# Patient Record
Sex: Male | Born: 1960 | Race: White | Hispanic: No | Marital: Married | Smoking: Current every day smoker
Health system: Southern US, Community
[De-identification: ages and names within clinical notes are randomized; demographics above are authoritative.]

## PROBLEM LIST (undated history)

## (undated) DIAGNOSIS — G4730 Sleep apnea, unspecified: Secondary | ICD-10-CM

## (undated) DIAGNOSIS — E785 Hyperlipidemia, unspecified: Secondary | ICD-10-CM

## (undated) DIAGNOSIS — I10 Essential (primary) hypertension: Secondary | ICD-10-CM

## (undated) DIAGNOSIS — R011 Cardiac murmur, unspecified: Secondary | ICD-10-CM

## (undated) DIAGNOSIS — Z720 Tobacco use: Secondary | ICD-10-CM

## (undated) DIAGNOSIS — G459 Transient cerebral ischemic attack, unspecified: Secondary | ICD-10-CM

## (undated) DIAGNOSIS — F99 Mental disorder, not otherwise specified: Secondary | ICD-10-CM

## (undated) DIAGNOSIS — I2510 Atherosclerotic heart disease of native coronary artery without angina pectoris: Secondary | ICD-10-CM

## (undated) DIAGNOSIS — F419 Anxiety disorder, unspecified: Secondary | ICD-10-CM

## (undated) DIAGNOSIS — I779 Disorder of arteries and arterioles, unspecified: Secondary | ICD-10-CM

## (undated) HISTORY — PX: CHOLECYSTECTOMY: SHX55

## (undated) HISTORY — DX: Disorder of arteries and arterioles, unspecified: I77.9

## (undated) HISTORY — DX: Tobacco use: Z72.0

## (undated) HISTORY — DX: Anxiety disorder, unspecified: F41.9

## (undated) HISTORY — DX: Essential (primary) hypertension: I10

## (undated) HISTORY — DX: Hyperlipidemia, unspecified: E78.5

## (undated) HISTORY — PX: ABDOMINAL AORTIC ANEURYSM REPAIR: SUR1152

## (undated) HISTORY — PX: CORONARY ARTERY BYPASS GRAFT: SHX141

## (undated) HISTORY — PX: APPENDECTOMY: SHX54

## (undated) HISTORY — PX: OTHER SURGICAL HISTORY: SHX169

## (undated) HISTORY — DX: Atherosclerotic heart disease of native coronary artery without angina pectoris: I25.10

---

## 2010-09-17 DIAGNOSIS — I779 Disorder of arteries and arterioles, unspecified: Secondary | ICD-10-CM

## 2010-09-17 HISTORY — DX: Disorder of arteries and arterioles, unspecified: I77.9

## 2010-09-28 ENCOUNTER — Inpatient Hospital Stay: Payer: Self-pay | Admitting: Internal Medicine

## 2010-10-02 ENCOUNTER — Ambulatory Visit: Payer: Self-pay | Admitting: Vascular Surgery

## 2010-10-12 DIAGNOSIS — I779 Disorder of arteries and arterioles, unspecified: Secondary | ICD-10-CM | POA: Insufficient documentation

## 2010-10-18 ENCOUNTER — Other Ambulatory Visit: Payer: Self-pay | Admitting: Internal Medicine

## 2010-10-18 ENCOUNTER — Emergency Department: Payer: Self-pay | Admitting: Emergency Medicine

## 2011-02-25 ENCOUNTER — Inpatient Hospital Stay: Payer: Self-pay | Admitting: Internal Medicine

## 2011-02-27 DIAGNOSIS — R079 Chest pain, unspecified: Secondary | ICD-10-CM

## 2011-03-14 ENCOUNTER — Encounter: Payer: Self-pay | Admitting: Internal Medicine

## 2011-03-18 ENCOUNTER — Encounter: Payer: Self-pay | Admitting: Internal Medicine

## 2011-03-28 ENCOUNTER — Ambulatory Visit (INDEPENDENT_AMBULATORY_CARE_PROVIDER_SITE_OTHER): Payer: BC Managed Care – PPO | Admitting: Psychology

## 2011-03-28 DIAGNOSIS — F4323 Adjustment disorder with mixed anxiety and depressed mood: Secondary | ICD-10-CM

## 2011-04-11 ENCOUNTER — Ambulatory Visit (INDEPENDENT_AMBULATORY_CARE_PROVIDER_SITE_OTHER): Payer: BC Managed Care – PPO | Admitting: Psychology

## 2011-04-11 DIAGNOSIS — F4323 Adjustment disorder with mixed anxiety and depressed mood: Secondary | ICD-10-CM

## 2011-04-18 ENCOUNTER — Encounter: Payer: Self-pay | Admitting: Internal Medicine

## 2011-05-02 ENCOUNTER — Ambulatory Visit (INDEPENDENT_AMBULATORY_CARE_PROVIDER_SITE_OTHER): Payer: BC Managed Care – PPO | Admitting: Psychology

## 2011-05-02 DIAGNOSIS — F4323 Adjustment disorder with mixed anxiety and depressed mood: Secondary | ICD-10-CM

## 2011-05-16 ENCOUNTER — Ambulatory Visit (INDEPENDENT_AMBULATORY_CARE_PROVIDER_SITE_OTHER): Payer: BC Managed Care – PPO | Admitting: Psychology

## 2011-05-16 DIAGNOSIS — F4323 Adjustment disorder with mixed anxiety and depressed mood: Secondary | ICD-10-CM

## 2011-05-30 ENCOUNTER — Ambulatory Visit: Payer: BC Managed Care – PPO | Admitting: Psychology

## 2011-06-12 ENCOUNTER — Ambulatory Visit: Payer: BC Managed Care – PPO | Admitting: Psychology

## 2011-06-20 ENCOUNTER — Ambulatory Visit: Payer: BC Managed Care – PPO | Admitting: Psychology

## 2011-10-10 ENCOUNTER — Encounter: Payer: Self-pay | Admitting: Internal Medicine

## 2011-10-10 ENCOUNTER — Other Ambulatory Visit: Payer: Self-pay | Admitting: *Deleted

## 2011-10-10 ENCOUNTER — Ambulatory Visit (INDEPENDENT_AMBULATORY_CARE_PROVIDER_SITE_OTHER): Payer: BC Managed Care – PPO | Admitting: Internal Medicine

## 2011-10-10 VITALS — BP 168/104 | HR 96 | Temp 98.5°F | Wt 219.0 lb

## 2011-10-10 DIAGNOSIS — J441 Chronic obstructive pulmonary disease with (acute) exacerbation: Secondary | ICD-10-CM

## 2011-10-10 DIAGNOSIS — F17200 Nicotine dependence, unspecified, uncomplicated: Secondary | ICD-10-CM

## 2011-10-10 DIAGNOSIS — I739 Peripheral vascular disease, unspecified: Secondary | ICD-10-CM | POA: Insufficient documentation

## 2011-10-10 DIAGNOSIS — Z720 Tobacco use: Secondary | ICD-10-CM | POA: Insufficient documentation

## 2011-10-10 DIAGNOSIS — Z716 Tobacco abuse counseling: Secondary | ICD-10-CM | POA: Insufficient documentation

## 2011-10-10 DIAGNOSIS — I10 Essential (primary) hypertension: Secondary | ICD-10-CM

## 2011-10-10 DIAGNOSIS — Z7189 Other specified counseling: Secondary | ICD-10-CM

## 2011-10-10 DIAGNOSIS — E785 Hyperlipidemia, unspecified: Secondary | ICD-10-CM

## 2011-10-10 LAB — CBC WITH DIFFERENTIAL/PLATELET
Eosinophils Absolute: 0 10*3/uL (ref 0.0–0.7)
HCT: 44.9 % (ref 39.0–52.0)
Hemoglobin: 15.2 g/dL (ref 13.0–17.0)
Lymphs Abs: 2.4 10*3/uL (ref 0.7–4.0)
MCH: 29.9 pg (ref 26.0–34.0)
MCHC: 33.9 g/dL (ref 30.0–36.0)
Monocytes Absolute: 0.6 10*3/uL (ref 0.1–1.0)
Monocytes Relative: 6 % (ref 3–12)
Neutro Abs: 7.9 10*3/uL — ABNORMAL HIGH (ref 1.7–7.7)
Neutrophils Relative %: 72 % (ref 43–77)
RBC: 5.09 MIL/uL (ref 4.22–5.81)

## 2011-10-10 MED ORDER — HYDROCODONE-ACETAMINOPHEN 5-500 MG PO TABS: 1.0000 | ORAL_TABLET | Freq: Four times a day (QID) | ORAL | Status: DC | PRN

## 2011-10-10 MED ORDER — LEVOFLOXACIN 500 MG PO TABS: 500.0000 mg | ORAL_TABLET | Freq: Every day | ORAL | Status: DC

## 2011-10-10 MED ORDER — PREDNISONE (PAK) 10 MG PO TABS: ORAL_TABLET | ORAL | Status: DC

## 2011-10-10 NOTE — Patient Instructions (Signed)
I am treating your for bbronchitis with prednsione, and levaquin.  Please finsih all or your medications.  I am adding AMLODIPINE FOR YOUR  Blood pressure .  One talbet daily

## 2011-10-10 NOTE — Progress Notes (Signed)
Subjective:    Patient ID: Bradley Prince, male    DOB: Feb 07, 1961, 51 y.o.   MRN: [id number]  HPI Bradley Prince is a 51 year old white male with a history of Klinefelter syndrome and histrionic personality disorder, chronic tobacco use with ongoing despite current coronary artery disease and peripheral vascular disease with interventions in 2011 and multiple hospitalizations and in 2012 for neurologic and musculoskeletal symptoms that were concerning for stroke. He has been lost to followup for the last 6-8 months and presents today with cough with initially with complaints of cough over anatomy is here he says he has not coughed at all but has been having chest pain for the last several days. He has resumed smoking and has not been taking his medications for blood for hypertension until very recently.   He does report some respiratory symptoms including a light cough with some wheezing. He's been taking Mucinex without significant relief. He denies fevers and myalgias.  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 Has been volunteering for the fire dept and last work was Monday morning, but had an air pack on.     /\ Past Medical History  Diagnosis Date  . Asthma   . Hypertension   . Hyperlipidemia   . Anxiety   . Tobacco abuse   . Peripheral arterial occlusive disease 2012    s/p PTCA/stent  . Coronary artery disease    No current outpatient prescriptions on file prior to visit.    Review of Systems  Constitutional: Negative for fever, chills, diaphoresis, activity change, appetite change, fatigue and unexpected weight change.  HENT: Negative for hearing loss, ear pain, nosebleeds, congestion, sore throat, facial swelling, rhinorrhea, sneezing, drooling, mouth sores, trouble swallowing, neck pain, neck stiffness, dental problem,  voice change, postnasal drip, sinus pressure, tinnitus and ear discharge.   Eyes: Negative for photophobia, pain, discharge, redness, itching and visual disturbance.  Respiratory: Positive for cough, chest tightness and shortness of breath. Negative for apnea, choking, wheezing and stridor.   Cardiovascular: Negative for chest pain, palpitations and leg swelling.  Gastrointestinal: Negative for nausea, vomiting, abdominal pain, diarrhea, constipation, blood in stool, abdominal distention, anal bleeding and rectal pain.  Genitourinary: Negative for dysuria, urgency, frequency, hematuria, flank pain, decreased urine volume, scrotal swelling, difficulty urinating and testicular pain.  Musculoskeletal: Negative for myalgias, back pain, joint swelling, arthralgias and gait problem.  Skin: Negative for color change, rash and wound.  Neurological: Negative for dizziness, tremors, seizures, syncope, speech difficulty, weakness, light-headedness, numbness and headaches.  Psychiatric/Behavioral: Negative for suicidal ideas, hallucinations, behavioral problems, confusion, sleep disturbance, dysphoric mood, decreased concentration and agitation. The patient is not nervous/anxious.        Objective:   Physical Exam  Constitutional: He is oriented to person, place, and time.  HENT:  Head: Normocephalic and atraumatic.  Mouth/Throat: Oropharynx is clear and moist.  Eyes: Conjunctivae and EOM are normal.  Neck: Normal range of motion. Neck supple. No JVD present. No thyromegaly present.  Cardiovascular: Normal rate, regular rhythm and normal heart sounds.   Pulmonary/Chest: Effort normal. He has wheezes. He has no rales.  Abdominal: Soft. Bowel sounds are normal. He exhibits no mass. There is no tenderness. There is no rebound.  Musculoskeletal: Normal range of motion. He exhibits no edema.  Neurological: He is alert and oriented  to person, place, and time.  Skin: Skin is warm and dry.  Psychiatric: He  has a normal mood and affect.          Assessment & Plan:

## 2011-10-11 ENCOUNTER — Encounter: Payer: Self-pay | Admitting: Internal Medicine

## 2011-10-11 DIAGNOSIS — I2510 Atherosclerotic heart disease of native coronary artery without angina pectoris: Secondary | ICD-10-CM | POA: Insufficient documentation

## 2011-10-11 DIAGNOSIS — E785 Hyperlipidemia, unspecified: Secondary | ICD-10-CM | POA: Insufficient documentation

## 2011-10-11 DIAGNOSIS — F419 Anxiety disorder, unspecified: Secondary | ICD-10-CM | POA: Insufficient documentation

## 2011-10-11 LAB — LIPID PANEL
Cholesterol: 221 mg/dL — ABNORMAL HIGH (ref 0–200)
HDL: 37 mg/dL — ABNORMAL LOW (ref >39)
Triglycerides: 232 mg/dL — ABNORMAL HIGH (ref <150)

## 2011-10-11 LAB — COMPREHENSIVE METABOLIC PANEL
Albumin: 4.6 g/dL (ref 3.5–5.2)
BUN: 7 mg/dL (ref 6–23)
CO2: 24 mEq/L (ref 19–32)
Calcium: 10.2 mg/dL (ref 8.4–10.5)
Glucose, Bld: 83 mg/dL (ref 70–99)
Potassium: 4.2 mEq/L (ref 3.5–5.3)
Sodium: 140 mEq/L (ref 135–145)
Total Protein: 7.7 g/dL (ref 6.0–8.3)

## 2011-10-11 LAB — CK TOTAL AND CKMB (NOT AT ARMC)

## 2011-10-11 NOTE — Assessment & Plan Note (Signed)
Spent 5 minutes exploring recent patient's reasons for resuming tobacco simply stated Dr. like the way the case he has no intention of quitting smoking at this time despite my discussion of the risks of recurrent and worsening peripheral vascular disease and coronary artery disease. We'll continue to advise him on some subsequent visits.

## 2011-10-11 NOTE — Assessment & Plan Note (Signed)
His hypertension is uncontrolled. He is undertaking his losartan for the last 2 weeks. He stopped the metoprolol because he did like to image her feel I am starting amlodipine today and ask him to return in a week for blood pressure check.

## 2011-10-11 NOTE — Assessment & Plan Note (Signed)
His cholesterol is above goal for someone with arteriosclerosis and continued tobacco abuse I will recommend resuming statin therapy however given his medical noncompliance I will not tolerate use of statin without regular regular followup for liver function testing will resume will discuss this at his next visit.

## 2011-10-11 NOTE — Assessment & Plan Note (Addendum)
He appears to be having a mild COPD exacerbation with wheezing on exam. He has not hypoxic and does not meet criteria for admission. We'll treat with Levaquin and prednisone for 7 days and evaluate further with chest x-ray if his symptoms do improve. Given his complaint of chest pain to the shoulder blades and to check cardiac enzymes today which were normal. Liver function tests were normal with exception of very mild elevation of alkaline phosphatase. If his pain persists we'll consider workup for gallbladder disease.

## 2011-10-12 ENCOUNTER — Other Ambulatory Visit: Payer: Self-pay

## 2011-10-12 ENCOUNTER — Encounter (HOSPITAL_COMMUNITY): Payer: Self-pay | Admitting: Cardiology

## 2011-10-12 ENCOUNTER — Telehealth: Payer: Self-pay | Admitting: Internal Medicine

## 2011-10-12 ENCOUNTER — Telehealth: Payer: Self-pay | Admitting: *Deleted

## 2011-10-12 ENCOUNTER — Emergency Department (HOSPITAL_COMMUNITY): Payer: BC Managed Care – PPO

## 2011-10-12 ENCOUNTER — Inpatient Hospital Stay (HOSPITAL_COMMUNITY)
Admission: EM | Admit: 2011-10-12 | Discharge: 2011-10-16 | DRG: 125 | Disposition: A | Discharge status: Home / Self Care | Payer: BC Managed Care – PPO | Attending: Internal Medicine | Admitting: Internal Medicine

## 2011-10-12 DIAGNOSIS — J441 Chronic obstructive pulmonary disease with (acute) exacerbation: Secondary | ICD-10-CM | POA: Diagnosis present

## 2011-10-12 DIAGNOSIS — Z720 Tobacco use: Secondary | ICD-10-CM | POA: Diagnosis present

## 2011-10-12 DIAGNOSIS — I2510 Atherosclerotic heart disease of native coronary artery without angina pectoris: Secondary | ICD-10-CM | POA: Diagnosis present

## 2011-10-12 DIAGNOSIS — R0789 Other chest pain: Principal | ICD-10-CM | POA: Diagnosis present

## 2011-10-12 DIAGNOSIS — R609 Edema, unspecified: Secondary | ICD-10-CM | POA: Diagnosis present

## 2011-10-12 DIAGNOSIS — F419 Anxiety disorder, unspecified: Secondary | ICD-10-CM | POA: Diagnosis present

## 2011-10-12 DIAGNOSIS — I739 Peripheral vascular disease, unspecified: Secondary | ICD-10-CM | POA: Diagnosis present

## 2011-10-12 DIAGNOSIS — R079 Chest pain, unspecified: Secondary | ICD-10-CM | POA: Diagnosis present

## 2011-10-12 DIAGNOSIS — Z8679 Personal history of other diseases of the circulatory system: Secondary | ICD-10-CM

## 2011-10-12 DIAGNOSIS — T466X5A Adverse effect of antihyperlipidemic and antiarteriosclerotic drugs, initial encounter: Secondary | ICD-10-CM | POA: Diagnosis present

## 2011-10-12 DIAGNOSIS — Z716 Tobacco abuse counseling: Secondary | ICD-10-CM

## 2011-10-12 DIAGNOSIS — I10 Essential (primary) hypertension: Secondary | ICD-10-CM | POA: Diagnosis present

## 2011-10-12 DIAGNOSIS — F17200 Nicotine dependence, unspecified, uncomplicated: Secondary | ICD-10-CM | POA: Diagnosis present

## 2011-10-12 DIAGNOSIS — R0600 Dyspnea, unspecified: Secondary | ICD-10-CM

## 2011-10-12 DIAGNOSIS — E785 Hyperlipidemia, unspecified: Secondary | ICD-10-CM | POA: Diagnosis present

## 2011-10-12 DIAGNOSIS — R531 Weakness: Secondary | ICD-10-CM | POA: Diagnosis not present

## 2011-10-12 DIAGNOSIS — E669 Obesity, unspecified: Secondary | ICD-10-CM | POA: Diagnosis present

## 2011-10-12 DIAGNOSIS — I779 Disorder of arteries and arterioles, unspecified: Secondary | ICD-10-CM | POA: Diagnosis present

## 2011-10-12 HISTORY — DX: Mental disorder, not otherwise specified: F99

## 2011-10-12 LAB — DIFFERENTIAL
Basophils Relative: 0 % (ref 0–1)
Eosinophils Absolute: 0 10*3/uL (ref 0.0–0.7)
Lymphs Abs: 2.1 10*3/uL (ref 0.7–4.0)
Monocytes Absolute: 0.7 10*3/uL (ref 0.1–1.0)
Monocytes Relative: 6 % (ref 3–12)
Neutrophils Relative %: 73 % (ref 43–77)

## 2011-10-12 LAB — BASIC METABOLIC PANEL
BUN: 10 mg/dL (ref 6–23)
Creatinine, Ser: 0.84 mg/dL (ref 0.50–1.35)
GFR calc Af Amer: >90 mL/min (ref >90)
GFR calc non Af Amer: >90 mL/min (ref >90)
Glucose, Bld: 107 mg/dL — ABNORMAL HIGH (ref 70–99)
Potassium: 3.6 mEq/L (ref 3.5–5.1)

## 2011-10-12 LAB — TROPONIN I: Troponin I: <0.3 ng/mL (ref <0.30)

## 2011-10-12 LAB — CBC
HCT: 41 % (ref 39.0–52.0)
Hemoglobin: 14 g/dL (ref 13.0–17.0)
MCH: 29.7 pg (ref 26.0–34.0)
MCHC: 34.1 g/dL (ref 30.0–36.0)
MCV: 87 fL (ref 78.0–100.0)
RBC: 4.71 MIL/uL (ref 4.22–5.81)

## 2011-10-12 LAB — CARDIAC PANEL(CRET KIN+CKTOT+MB+TROPI): Total CK: 62 U/L (ref 7–232)

## 2011-10-12 MED ORDER — IPRATROPIUM BROMIDE 0.02 % IN SOLN: 0.5000 mg | Freq: Four times a day (QID) | RESPIRATORY_TRACT | Status: DC | PRN

## 2011-10-12 MED ORDER — LORAZEPAM 2 MG/ML IJ SOLN
0.5000 mg | Freq: Four times a day (QID) | INTRAMUSCULAR | Status: DC | PRN
  Administered 2011-10-12 – 2011-10-15 (×5): 0.5 mg via INTRAVENOUS
  Filled 2011-10-12 (×5): qty 1

## 2011-10-12 MED ORDER — ONDANSETRON HCL 4 MG/2ML IJ SOLN
INTRAMUSCULAR | Status: AC
  Filled 2011-10-12: qty 2

## 2011-10-12 MED ORDER — HYDROCODONE-ACETAMINOPHEN 5-325 MG PO TABS
1.0000 | ORAL_TABLET | Freq: Four times a day (QID) | ORAL | Status: DC | PRN
  Administered 2011-10-12 – 2011-10-13 (×2): 1 via ORAL
  Filled 2011-10-12 (×2): qty 1

## 2011-10-12 MED ORDER — MORPHINE SULFATE 2 MG/ML IJ SOLN
2.0000 mg | Freq: Once | INTRAMUSCULAR | Status: AC
  Administered 2011-10-12: 2 mg via INTRAVENOUS
  Filled 2011-10-12: qty 1

## 2011-10-12 MED ORDER — IOHEXOL 350 MG/ML SOLN
50.0000 mL | Freq: Once | INTRAVENOUS | Status: AC | PRN
  Administered 2011-10-12: 50 mL via INTRAVENOUS

## 2011-10-12 MED ORDER — NITROGLYCERIN 2 % TD OINT
TOPICAL_OINTMENT | TRANSDERMAL | Status: AC
  Filled 2011-10-12: qty 1

## 2011-10-12 MED ORDER — IPRATROPIUM BROMIDE 0.02 % IN SOLN
0.5000 mg | Freq: Once | RESPIRATORY_TRACT | Status: AC
  Administered 2011-10-12: 0.5 mg via RESPIRATORY_TRACT
  Filled 2011-10-12: qty 2.5

## 2011-10-12 MED ORDER — ROSUVASTATIN CALCIUM 5 MG PO TABS
5.0000 mg | ORAL_TABLET | Freq: Every day | ORAL | Status: DC
  Administered 2011-10-12 – 2011-10-13 (×2): 5 mg via ORAL
  Filled 2011-10-12 (×3): qty 1

## 2011-10-12 MED ORDER — MORPHINE SULFATE 2 MG/ML IJ SOLN
1.0000 mg | INTRAMUSCULAR | Status: DC | PRN
  Administered 2011-10-12: 1 mg via INTRAVENOUS
  Filled 2011-10-12: qty 1

## 2011-10-12 MED ORDER — SODIUM CHLORIDE 0.9 % IV SOLN: 250.0000 mL | INTRAVENOUS | Status: DC | PRN

## 2011-10-12 MED ORDER — SODIUM CHLORIDE 0.9 % IJ SOLN
3.0000 mL | Freq: Two times a day (BID) | INTRAMUSCULAR | Status: DC
  Administered 2011-10-12 – 2011-10-14 (×4): 3 mL via INTRAVENOUS

## 2011-10-12 MED ORDER — ALBUTEROL SULFATE (5 MG/ML) 0.5% IN NEBU: 2.5000 mg | INHALATION_SOLUTION | RESPIRATORY_TRACT | Status: DC | PRN

## 2011-10-12 MED ORDER — LEVOFLOXACIN 500 MG PO TABS
500.0000 mg | ORAL_TABLET | Freq: Every day | ORAL | Status: DC
  Administered 2011-10-12 – 2011-10-15 (×4): 500 mg via ORAL
  Filled 2011-10-12 (×4): qty 1

## 2011-10-12 MED ORDER — MORPHINE SULFATE 4 MG/ML IJ SOLN
4.0000 mg | Freq: Once | INTRAMUSCULAR | Status: AC
  Administered 2011-10-12: 4 mg via INTRAVENOUS
  Filled 2011-10-12: qty 1

## 2011-10-12 MED ORDER — ONDANSETRON HCL 4 MG/2ML IJ SOLN
4.0000 mg | Freq: Once | INTRAMUSCULAR | Status: DC
  Filled 2011-10-12: qty 2

## 2011-10-12 MED ORDER — SODIUM CHLORIDE 0.9 % IJ SOLN: 3.0000 mL | INTRAMUSCULAR | Status: DC | PRN

## 2011-10-12 MED ORDER — ONDANSETRON HCL 4 MG/2ML IJ SOLN
4.0000 mg | Freq: Once | INTRAMUSCULAR | Status: AC
  Administered 2011-10-12 (×2): 4 mg via INTRAVENOUS

## 2011-10-12 MED ORDER — ONDANSETRON HCL 4 MG/2ML IJ SOLN
4.0000 mg | Freq: Four times a day (QID) | INTRAMUSCULAR | Status: DC | PRN
  Administered 2011-10-14: 4 mg via INTRAVENOUS

## 2011-10-12 MED ORDER — ONDANSETRON HCL 4 MG/2ML IJ SOLN
INTRAMUSCULAR | Status: AC
  Administered 2011-10-12: 4 mg via INTRAVENOUS
  Filled 2011-10-12: qty 2

## 2011-10-12 MED ORDER — NITROGLYCERIN 0.4 MG SL SUBL
0.4000 mg | SUBLINGUAL_TABLET | SUBLINGUAL | Status: DC | PRN
  Administered 2011-10-13: 0.4 mg via SUBLINGUAL
  Filled 2011-10-12: qty 25

## 2011-10-12 MED ORDER — NICOTINE 14 MG/24HR TD PT24
14.0000 mg | MEDICATED_PATCH | Freq: Every day | TRANSDERMAL | Status: DC
  Administered 2011-10-12 – 2011-10-16 (×5): 14 mg via TRANSDERMAL
  Filled 2011-10-12 (×5): qty 1

## 2011-10-12 MED ORDER — ALBUTEROL SULFATE (5 MG/ML) 0.5% IN NEBU
5.0000 mg | INHALATION_SOLUTION | Freq: Once | RESPIRATORY_TRACT | Status: AC
  Administered 2011-10-12: 5 mg via RESPIRATORY_TRACT
  Filled 2011-10-12: qty 1

## 2011-10-12 MED ORDER — ASPIRIN 81 MG PO TABS: 81.0000 mg | ORAL_TABLET | Freq: Every day | ORAL | Status: DC

## 2011-10-12 MED ORDER — AMLODIPINE BESYLATE 10 MG PO TABS
10.0000 mg | ORAL_TABLET | Freq: Every day | ORAL | Status: DC
  Administered 2011-10-13 – 2011-10-16 (×4): 10 mg via ORAL
  Filled 2011-10-12 (×4): qty 1

## 2011-10-12 MED ORDER — DIPHENHYDRAMINE HCL 25 MG PO CAPS: 25.0000 mg | ORAL_CAPSULE | Freq: Once | ORAL | Status: DC

## 2011-10-12 MED ORDER — PREDNISONE (PAK) 10 MG PO TABS
10.0000 mg | ORAL_TABLET | Freq: Every day | ORAL | Status: DC
  Administered 2011-10-13: 30 mg via ORAL
  Filled 2011-10-12: qty 21

## 2011-10-12 MED ORDER — AMLODIPINE BESYLATE 10 MG PO TABS: 10.0000 mg | ORAL_TABLET | Freq: Every day | ORAL | Status: DC

## 2011-10-12 MED ORDER — ENOXAPARIN SODIUM 40 MG/0.4ML ~~LOC~~ SOLN
40.0000 mg | SUBCUTANEOUS | Status: DC
  Administered 2011-10-12 – 2011-10-14 (×3): 40 mg via SUBCUTANEOUS
  Filled 2011-10-12 (×4): qty 0.4

## 2011-10-12 MED ORDER — LOSARTAN POTASSIUM 50 MG PO TABS
100.0000 mg | ORAL_TABLET | Freq: Every day | ORAL | Status: DC
  Administered 2011-10-12 – 2011-10-16 (×5): 100 mg via ORAL
  Filled 2011-10-12 (×5): qty 2

## 2011-10-12 NOTE — ED Notes (Signed)
Pt to department c/o increasing chest pain over the past 3 days. Reports that the pain got worse this morning at 3am. Pt reports SOB but denies any n/v with the pain. 324 ASA 2 SL nitro.

## 2011-10-12 NOTE — H&P (Addendum)
PCP:  Duncan Dull, MD, MD   DOA:  10/12/2011 11:40 AM  Chief Complaint:  Chest  Pain   HPI: 51 years old Caucasian man presented to the ER with chief complain of intermittent chest pain in Monday associated with shortness of breath and progressively getting worse. He described it as sharp pain on the left chest and radiating to his left shoulder, denies any associated nausea or diaphoresis. He was seen by his PCP couple of days ago and  was prescribed antibiotics and prednisone. His symptoms get worse today , he called his doctor will advise him to come to the ER. In the ED EKG was unremarkable, cardiac enzymes negative x2 however patient continued to have chest pain and we are asked to admit for further management.  Allergies: Allergies  Allergen Reactions  . Penicillins Itching  . Toradol Swelling    Tongue swells    Pri will give her a copyor to Admission medications   Medication Sig Start Date End Date Taking? Authorizing Provider  amLODipine (NORVASC) 10 MG tablet Take 10 mg by mouth daily.   Yes Historical Provider, MD  aspirin 81 MG tablet Take 81 mg by mouth daily.   Yes Historical Provider, MD  HYDROcodone-acetaminophen (VICODIN) 5-500 MG per tablet Take 1 tablet by mouth every 6 (six) hours as needed. For pain   Yes Historical Provider, MD  levofloxacin (LEVAQUIN) 500 MG tablet Take 500 mg by mouth daily. Started 10/10/11. For 7 days   Yes Historical Provider, MD  losartan (COZAAR) 100 MG tablet Take 100 mg by mouth daily.   Yes Historical Provider, MD  predniSONE (STERAPRED UNI-PAK) 10 MG tablet Take 10-60 mg by mouth daily. 60 mg on day 1, then reduce by 1 tablet each day until gone   Yes Historical Provider, MD    Past Medical History  Diagnosis Date  . Asthma   . Hypertension   . Hyperlipidemia   . Anxiety   . Tobacco abuse   . Peripheral arterial occlusive disease 2012    s/p PTCA/stent  . Coronary artery disease   . Mental disorder     histronic personality  disorder    Past Surgical History  Procedure Date  . Appendectomy   . Cholecystectomy   . Abdominal aortic aneurysm repair   . Renal artery stents     Social History:  reports that he has been smoking Cigarettes for more than 20 years currently smoking about half a pack a day. He reports that he drinks alcohol occasionally. He reports that he does not use illicit drugs.  Family History  Problem Relation Age of Onset  . Diabetes Mother   . Hypertension Father   His mother had heart disease and died in her 20s.   Review of Systems:  Constitutional: Denies fever, chills, diaphoresis, appetite change and fatigue.  HEENT: Denies photophobia, eye pain, redness, hearing loss, ear pain, congestion, sore throat, rhinorrhea, sneezing, mouth sores, trouble swallowing, neck pain, neck stiffness and tinnitus.   Respiratory: c/o SOB, DOE, denies cough, chest tightness,  and wheezing.   Cardiovascular: c/o  chest pain,denies palpitations and leg swelling.  Gastrointestinal: Denies nausea, vomiting, abdominal pain, diarrhea, constipation, blood in stool and abdominal distention.  Genitourinary: Denies dysuria, urgency, frequency, hematuria, flank pain and difficulty urinating.  Neurological: Denies dizziness, seizures, syncope, weakness, light-headedness, numbness and headaches.     Physical Exam:  Filed Vitals:   10/12/11 1435 10/12/11 1529 10/12/11 1746 10/12/11 1754  BP: 127/78 128/78 108/62 112/66  Pulse: 71  71 63  Temp:      TempSrc:      Resp: 24 29 14 24   SpO2: 98% 99% 96% 99%    Constitutional: Vital signs reviewed.  Patient is a  In moderate acute distress, anxious however  cooperative with exam. Alert and oriented x3.  Head: Normocephalic and atraumatic Eyes: PERRL, EOMI, conjunctivae normal, No scleral icterus.  Neck: Supple, Trachea midline normal ROM, No JVD, mass, thyromegaly, or carotid bruit present.  Cardiovascular: RRR, S1 normal, S2 normal, no MRG, pulses symmetric  and intact bilaterally Pulmonary/Chest: CTAB, no wheezes, rales, or rhonchi Abdominal: Soft. Non-tender, non-distended, bowel sounds are normal, no masses, organomegaly, or guarding present.  Ext: no edema and no cyanosis, pulses palpable bilaterally   Neurological: A&O x3, Strenght is normal and symmetric bilaterally, cranial nerve II-XII are grossly intact, no focal motor deficit, sensory intact to light touch bilaterally.    Labs on Admission:  Results for orders placed during the hospital encounter of 10/12/11 (from the past 48 hour(s))  CBC     Status: Normal   Collection Time   10/12/11 12:53 PM      Component Value Range Comment   WBC 10.3  4.0 - 10.5 (K/uL)    RBC 4.71  4.22 - 5.81 (MIL/uL)    Hemoglobin 14.0  13.0 - 17.0 (g/dL)    HCT 16.1  09.6 - 04.5 (%)    MCV 87.0  78.0 - 100.0 (fL)    MCH 29.7  26.0 - 34.0 (pg)    MCHC 34.1  30.0 - 36.0 (g/dL)    RDW 40.9  81.1 - 91.4 (%)    Platelets 300  150 - 400 (K/uL)   DIFFERENTIAL     Status: Normal   Collection Time   10/12/11 12:53 PM      Component Value Range Comment   Neutrophils Relative 73  43 - 77 (%)    Neutro Abs 7.5  1.7 - 7.7 (K/uL)    Lymphocytes Relative 20  12 - 46 (%)    Lymphs Abs 2.1  0.7 - 4.0 (K/uL)    Monocytes Relative 6  3 - 12 (%)    Monocytes Absolute 0.7  0.1 - 1.0 (K/uL)    Eosinophils Relative 0  0 - 5 (%)    Eosinophils Absolute 0.0  0.0 - 0.7 (K/uL)    Basophils Relative 0  0 - 1 (%)    Basophils Absolute 0.0  0.0 - 0.1 (K/uL)   BASIC METABOLIC PANEL     Status: Abnormal   Collection Time   10/12/11 12:53 PM      Component Value Range Comment   Sodium 139  135 - 145 (mEq/L)    Potassium 3.6  3.5 - 5.1 (mEq/L)    Chloride 103  96 - 112 (mEq/L)    CO2 26  19 - 32 (mEq/L)    Glucose, Bld 107 (*) 70 - 99 (mg/dL)    BUN 10  6 - 23 (mg/dL)    Creatinine, Ser 7.82  0.50 - 1.35 (mg/dL)    Calcium 8.9  8.4 - 10.5 (mg/dL)    GFR calc non Af Amer >90  >90 (mL/min)    GFR calc Af Amer >90  >90  (mL/min)   TROPONIN I     Status: Normal   Collection Time   10/12/11 12:53 PM      Component Value Range Comment   Troponin I <0.30  <0.30 (ng/mL)  D-DIMER, QUANTITATIVE     Status: Abnormal   Collection Time   10/12/11 12:53 PM      Component Value Range Comment   D-Dimer, Quant 0.74 (*) 0.00 - 0.48 (ug/mL-FEU)   TROPONIN I     Status: Normal   Collection Time   10/12/11  3:23 PM      Component Value Range Comment   Troponin I <0.30  <0.30 (ng/mL)     Radiological Exams on Admission: Dg Chest 2 View  10/12/2011  *RADIOLOGY REPORT*  Clinical Data: Shortness of breath  CHEST - 2 VIEW  Comparison: None.  Findings: Prior median sternotomy.  Cardiomegaly.  No confluent airspace opacities, effusions or edema.  No acute bony abnormality.  IMPRESSION: Cardiomegaly.  No active disease.  Original Report Authenticated By: Cyndie Chime, M.D.   Ct Angio Chest W/cm &/or Wo Cm  10/12/2011  *RADIOLOGY REPORT*  Clinical Data: Chest pain past 3 days.  Prior CABG.  Smoker. Cough.  Asthma. Shortness of breath.  CT ANGIOGRAPHY CHEST  .  IMPRESSION: Artifact extends thru the pulmonary arteries.  No pulmonary embolus noted.  Prominence of the main pulmonary arteries may be related to component of pulmonary hypertension or possibly congenital in origin.  Post CABG.  Proximal right coronary artery calcifications.  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  Original Report Authenticated By: Fuller Canada, M.D.   Assessment/Plan Principal Problem:  *Chest pain Active Problems:  COPD with acute exacerbation  Hypertension  Tobacco abuse  Hyperlipidemia Plan Admit to telemetry. Cardiac enzymes are negative x2   continue nitroglycerin when necessary Oxygen supplementation next when necessary,nebs PRN Will continue outpatient Levaquin and prednisone ,started 10/10/11  for presumed COPD exacerbation. On exam today I do not  appreciate any rales or wheezing. Chest x-ray showed no acute abnormalities. CTA chest showed no PE however showed some atherosclerotic disease and calcification of the proximal right coronary artery and thoracic aorta ectasia  as above. We will order  For  2-D echocardiogram.check pro BNP . I will start him on Crestor  Dr Herbie Baltimore with [facility] was consulted and he will kindly see him in consultation.  patient was counseled on  smoking cessation, we will order for  nicotine patch .tobacco cessation consult. Blood pressure is controlled on losartan and amlodipine to continue. Patient is full code  Time Spent on Admission:  approximately 50 minutes   Bradley Prince 10/12/2011, 6:37 PM

## 2011-10-12 NOTE — ED Notes (Signed)
Returned from xray

## 2011-10-12 NOTE — Progress Notes (Signed)
Patient having left chest pain radiating to shoulder lasting about 20 minutes. Patient states it hurts when he breathes in. Continuous SOB, patient clammy. Chest pain and SOB unrelieved by morphine, ativan, or nitro. Vitals, T 97.8 P 64 R 18 BP 99/60 02 98 2L [place]. Dr. Herbie Baltimore was informed, and stopped by to see Bradley Prince. Prn morphine iv was increased to 2mg /ml, and ambien was ordered for administration to help Bradley Prince sleep. Will continue to monitor.

## 2011-10-12 NOTE — ED Notes (Addendum)
Attempted to call report but receiving RN is unavailable. Pt claimed that chest pain is better

## 2011-10-12 NOTE — ED Notes (Signed)
Assumed patient's care, pt alert,awake, oriented x 4, pt denies any chest discomfort but is c/o shortness of breath. O2 continues at 2 LPM/[place] with O2 saturation of 99%. Will conitnue to monitor.

## 2011-10-12 NOTE — ED Notes (Signed)
Provided patient with cranberry juice per request.

## 2011-10-12 NOTE — Telephone Encounter (Signed)
error 

## 2011-10-12 NOTE — Telephone Encounter (Signed)
Pt called stating that his chest pain is worse, radiating into left shoulder and neck this morning.  Per Dr. Darrick Huntsman, advised pt to go to ER, advised him to call EMS and not drive himself.  Follow up as needed.

## 2011-10-12 NOTE — ED Provider Notes (Signed)
History     CSN: [id number]  Arrival date & time 10/12/11  1140   First MD Initiated Contact with Patient 10/12/11 1153      Chief Complaint  Patient presents with  . Chest Pain    (Consider location/radiation/quality/duration/timing/severity/associated sxs/prior treatment) HPI History provided by pt.   Pt has had intermittent left-sided chest pressure for the past 4-5 days.  Usually lasts for approx and resolves spontaneously.  Pain radiated to jaw 2 days ago and today he feels it in his left shoulder.  Pain is non-positional, non-exertional and non-pleuritic.  Woke him from sleep last night.  Associated w/ nausea and SOB.  No known CAD.  Is non-compliant w/ HTN and cholesterol medication and smokes 1PPD.  H/o DVT in RLE following open heart surgery for aortic aneurysm 18years ago.  Is not currently anti-coagulated.  Per prior chart, seen by PCP 2 days ago and suspected of COPD at that time.  Was started on levaquin and prednisone which he has been compliant with.   Past Medical History  Diagnosis Date  . Asthma   . Hypertension   . Hyperlipidemia   . Anxiety   . Tobacco abuse   . Peripheral arterial occlusive disease 2012    s/p PTCA/stent  . Coronary artery disease     Past Surgical History  Procedure Date  . Appendectomy   . Cholecystectomy     Family History  Problem Relation Age of Onset  . Diabetes Mother   . Hypertension Father     History  Substance Use Topics  . Smoking status: Current Everyday Smoker -- 0.2 packs/day for 15 years    Types: Cigarettes  . Smokeless tobacco: Not on file  . Alcohol Use: Not on file      Review of Systems  All other systems reviewed and are negative.    Allergies  Penicillins and Toradol  Home Medications   Current Outpatient Rx  Name Route Sig Dispense Refill  . AMLODIPINE BESYLATE 10 MG PO TABS Oral Take 10 mg by mouth daily.    . ASPIRIN 81 MG PO TABS Oral Take 81 mg by mouth daily.    Marland Kitchen  HYDROCODONE-ACETAMINOPHEN 5-500 MG PO TABS Oral Take 1 tablet by mouth every 6 (six) hours as needed. For pain    . LEVOFLOXACIN 500 MG PO TABS Oral Take 500 mg by mouth daily. Started 10/10/11. For 7 days    . LOSARTAN POTASSIUM 100 MG PO TABS Oral Take 100 mg by mouth daily.    Marland Kitchen PREDNISONE (PAK) 10 MG PO TABS Oral Take 10-60 mg by mouth daily. 60 mg on day 1, then reduce by 1 tablet each day until gone      BP 153/92  Pulse 79  Temp(Src) 98 F (36.7 C) (Oral)  Resp 19  SpO2 100%  Physical Exam  Nursing note and vitals reviewed. Constitutional: He is oriented to person, place, and time. He appears well-developed and well-nourished. No distress.  HENT:  Head: Normocephalic and atraumatic.  Eyes:       Normal appearance  Neck: Normal range of motion.  Cardiovascular: Normal rate and regular rhythm.   Pulmonary/Chest: Effort normal and breath sounds normal. He exhibits no tenderness.       Mild respiratory distress w/ broken sentences.  His wife says that this is not atypical.   Abdominal: Soft. Bowel sounds are normal. He exhibits no distension. There is no tenderness. There is no guarding.  Musculoskeletal: He exhibits no  edema and no tenderness.       2+ Dorsalis Pedis pulses  Neurological: He is alert and oriented to person, place, and time.  Skin: Skin is warm and dry. No rash noted. He is not diaphoretic.  Psychiatric: He has a normal mood and affect. His behavior is normal.    ED Course  Procedures (including critical care time)   Date: 10/12/2011 (11:43)  Rate: 88  Rhythm: normal sinus rhythm  QRS Axis: normal  Intervals: normal  ST/T Wave abnormalities: normal  Conduction Disutrbances:none  Narrative Interpretation:   Old EKG Reviewed: none available   Date: 10/12/2011 (14:48)  Rate: 69  Rhythm: normal sinus rhythm  QRS Axis: normal  Intervals: normal  ST/T Wave abnormalities: normal  Conduction Disutrbances:none  Narrative Interpretation: artifact  Old EKG  Reviewed: unchanged     Labs Reviewed  BASIC METABOLIC PANEL - Abnormal; Notable for the following:    Glucose, Bld 107 (*)    All other components within normal limits  D-DIMER, QUANTITATIVE - Abnormal; Notable for the following:    D-Dimer, Quant 0.74 (*)    All other components within normal limits  CBC  DIFFERENTIAL  TROPONIN I  TROPONIN I  CARDIAC PANEL(CRET KIN+CKTOT+MB+TROPI)  BRAIN NATRIURETIC PEPTIDE  CBC  BASIC METABOLIC PANEL   Dg Chest 2 View  10/12/2011  *RADIOLOGY REPORT*  Clinical Data: Shortness of breath  CHEST - 2 VIEW  Comparison: None.  Findings: Prior median sternotomy.  Cardiomegaly.  No confluent airspace opacities, effusions or edema.  No acute bony abnormality.  IMPRESSION: Cardiomegaly.  No active disease.  Original Report Authenticated By: Cyndie Chime, M.D.     1. Dyspnea   2. Chest pain       MDM  Pt w/ poorly controlled HTN and cholesterol and smokes 1PPD presents w/ intermittent left-sided, radiating chest pressure w/ SOB and nausea x 1 week.  Remote h/o DVT.   Exam sig for mild respiratory distress and expiratory wheezing right lung base.  Labs sig for elevated d-dimer.  CXR neg.  Pt received aspirin en route.  Gave him a breathing tmnt which did not improve sx but pain resolved w/ morphine.  On re-evaluation, pain has worsened, pt is becoming more short of breath, diaphoretic and anxious appearing.  RR low 30s but VS otherwise stable. Reports that he is starting to feel bad.  EKG repeated and continues to be non-ischemic.  Repeat troponin as well as CT angio chest pending.   Nitro paste and second dose of morphine administered.  2:08 PM   RR, and pt is no longer diaphoretic or anxious appearing.  He may have been experiencing a panic attack; has a h/o anxiety.  CP has resolved and SOB slightly improved.  CTA chest neg for PE.  Second troponin neg.  Attempted to ambulate patient and became very dyspneic.  He continues to have intermittent CP.   [facility] consulted for admission for r/o MI.          Otilio Miu, [place] 10/13/11 [phone]

## 2011-10-12 NOTE — ED Notes (Signed)
Ordered meal tray. 

## 2011-10-12 NOTE — ED Notes (Signed)
[id number] Ready 

## 2011-10-12 NOTE — ED Notes (Signed)
Report given to Samantha RN

## 2011-10-12 NOTE — ED Notes (Signed)
Pt medicated as ordered for chest pain, will monitor

## 2011-10-13 ENCOUNTER — Other Ambulatory Visit: Payer: Self-pay

## 2011-10-13 LAB — BASIC METABOLIC PANEL WITH GFR
BUN: 10 mg/dL (ref 6–23)
CO2: 27 meq/L (ref 19–32)
Calcium: 8.3 mg/dL — ABNORMAL LOW (ref 8.4–10.5)
Chloride: 104 meq/L (ref 96–112)
Creatinine, Ser: 0.86 mg/dL (ref 0.50–1.35)
GFR calc Af Amer: >90 mL/min
GFR calc non Af Amer: >90 mL/min
Glucose, Bld: 129 mg/dL — ABNORMAL HIGH (ref 70–99)
Potassium: 4.9 meq/L (ref 3.5–5.1)
Sodium: 137 meq/L (ref 135–145)

## 2011-10-13 LAB — CBC
HCT: 38.3 % — ABNORMAL LOW (ref 39.0–52.0)
MCV: 89.3 fL (ref 78.0–100.0)
RBC: 4.29 MIL/uL (ref 4.22–5.81)
RDW: 14 % (ref 11.5–15.5)
WBC: 6.1 10*3/uL (ref 4.0–10.5)

## 2011-10-13 LAB — CARDIAC PANEL(CRET KIN+CKTOT+MB+TROPI)
CK, MB: 1.6 ng/mL (ref 0.3–4.0)
Relative Index: INVALID (ref 0.0–2.5)
Total CK: 78 U/L (ref 7–232)
Troponin I: <0.3 ng/mL (ref <0.30)

## 2011-10-13 MED ORDER — ISOSORBIDE MONONITRATE ER 30 MG PO TB24
30.0000 mg | ORAL_TABLET | Freq: Every day | ORAL | Status: DC
  Administered 2011-10-13 – 2011-10-14 (×2): 30 mg via ORAL
  Filled 2011-10-13 (×3): qty 1

## 2011-10-13 MED ORDER — ACETAMINOPHEN 325 MG PO TABS
650.0000 mg | ORAL_TABLET | Freq: Four times a day (QID) | ORAL | Status: DC | PRN
  Filled 2011-10-13: qty 2

## 2011-10-13 MED ORDER — ZOLPIDEM TARTRATE 5 MG PO TABS
5.0000 mg | ORAL_TABLET | Freq: Every evening | ORAL | Status: DC | PRN
  Administered 2011-10-13 – 2011-10-14 (×2): 5 mg via ORAL
  Filled 2011-10-13 (×2): qty 1

## 2011-10-13 MED ORDER — MORPHINE SULFATE 2 MG/ML IJ SOLN
2.0000 mg | INTRAMUSCULAR | Status: DC | PRN
  Administered 2011-10-13: 2 mg via INTRAVENOUS
  Filled 2011-10-13: qty 1

## 2011-10-13 MED ORDER — MORPHINE SULFATE 2 MG/ML IJ SOLN
2.0000 mg | INTRAMUSCULAR | Status: DC | PRN
  Administered 2011-10-13 – 2011-10-14 (×5): 2 mg via INTRAVENOUS
  Filled 2011-10-13 (×5): qty 1

## 2011-10-13 NOTE — ED Provider Notes (Signed)
Medical screening examination/treatment/procedure(s) were conducted as a shared visit with non-physician practitioner(s) and myself.  I personally evaluated the patient during the encounter  Scheryl Sanborn, MD 10/13/11 1108 

## 2011-10-13 NOTE — Consult Note (Signed)
BRIEF / PRELIMINARY CONSULT NOTE   I have seen and examined the patient as well as reviewed the chart.  Full note to follow.  51 y/o man with cardiac risk factors of HTN, HLD, chronic smoker as well as Renal Artery Stenosis s/p PTA/Stent in 2011 who is s/p Thoracic Aortic Aneurysm (TAA) Sgx - with Dacron graft placement & re-attachment of coronaries (1996).  He has never been told that he has a h/o CAD & does not remember having had a stress test.  He did have a cath prior to his surgery in 1996.  He began noting a pressure sensation in his chest - "like a cold is coming on" this past Mon (1/21)".  This feeling would "come & go" lasting up to ~20 min or so.  He was better on Tuesday, but the symptom recurred on Wed - associated with SOB and ~nausea.  He called his PMD & was seen - given steroids & Abx, for ? Wheezing / bronchitis -- with no help.  Sx abated on Wed, and he took the day off work on [employer] -- still thinking that he was brewing a cold.  Sx off on on on thurs, came on ~ 0300 on Friday AM with pressure / SOB & nausea, then resolved.  He got up to go to work & while driving out of his driveway, it recurred & radiated to throat & L shoulder.  Again, he felt SOB.  Called his PMD & told to call 911.  Admitted to [facility] to r/o MI & Rx possible URI.    [facility] consulted to assist with evaluating his Sx.  Exam - benign,  ? Some reproducibility of location of pain, but not clear.  Pain L upper chest --> to throat & L shoulder ECG - no dynamic ischemic changes, just ST flattening Troponin - neg x 2.   IMPRESSION:  SSx concering for crescendo angina -- some components are worrisome, location is not substernal - but described as pressure/heavy sensation.  However with Sx lasting up to 20 min, would expect + Troponins.    Notable Cardiac Risk Factors with no ECG changes & negative Troponins - TIMI Risk Score of 3 (>2 episodes of "angina" in past 24 hrs, ASA use, 3 RFs - smoking, HTN, HLD).     Other potential etiologies = MSK pain (he carries a Dx of Histrionic Personality Disorder), issues with the TAA graft (nothing noted on CTA chest), URI, GI less likely given location.  I am also concerned for the possibility of spontaneous coronary dissection (he likely has some connective tissue disorder that led to the TAA),    RECOMMENDATIONS:  My recommendation is that with his RISK FACTORS and story, we cannot discount the possibility of CAD.  I discussed with him the various options for how to proceed with either NST vs. Cath.  He is hoping that he feels well tomorrow & can go home.  Certainly, if his symptoms abate & he wants to go, this is reasonable with negative biomarkers.  We would help arrange for OP f/u to discuss NST vs. Cath @ that time.  He is not very excited about the prospect of cath, but does agree that this would be the most definitive was to resolve the question.    We will tentatively plan for [facility] +/- PCI on Monday -- pending no change in Sx over the weekend.  Would change to ACS Rx dose of Lovenox  I have added Imdur (he gets  HA with NTG paste) & increased the Morphine PRN dose.  Also wrote for Ambien for sleep  With his BP currently ~100, I am reluctant to add BB until his anatomy is defined.  Continue statin & smoking cessation counseling.  Continue treating URI Sx as you are.  We will follow along over the weekend.   Full note to follow in the AM.     Marykay Lex, M.D., M.S. [facility] [address] [place], [place]  [address]  [phone]  10/13/2011 1:01 AM

## 2011-10-13 NOTE — Consult Note (Signed)
[facility]  CONSULTATION NOTE  10/13/2011 2:22 AM  Reason for Consult: Intermittent Chest Pain  Requesting Physician: Baltazar Najjar, MD Primary Care Provider: Duncan Dull, MD, MD Consulting Cardiologist: Marykay Lex, M.D., MS   IMPRESSION:  SSx concering for crescendo angina -- some components are worrisome, location is not substernal - but described as pressure/heavy sensation. However with Sx lasting up to 20 min, would expect + Troponins.  Notable Cardiac Risk Factors with no ECG changes & negative Troponins - TIMI Risk Score of 3 (>2 episodes of "angina" in past 24 hrs, ASA use, 3 RFs - smoking, HTN, HLD).  Other potential etiologies = MSK pain (he carries a Dx of Histrionic Personality Disorder), issues with the TAA graft (nothing noted on CTA chest), URI, GI less likely given location. I am also concerned for the possibility of spontaneous coronary dissection (he likely has some connective tissue disorder that led to the TAA),   RECOMMENDATIONS:  My recommendation is that with his RISK FACTORS and story, we cannot discount the possibility of CAD. I discussed with him the various options for how to proceed with either NST vs. Cath. He is hoping that he feels well tomorrow & can go home. Certainly, if his symptoms abate & he wants to go, this is reasonable with negative biomarkers. We would help arrange for OP f/u to discuss NST vs. Cath @ that time.  He is not very excited about the prospect of cath, but does agree that this would be the most definitive was to resolve the question.  We will tentatively plan for [facility] +/- PCI on Monday -- pending no change in Sx over the weekend.  Would change to ACS Rx dose of Lovenox  I have added Imdur (he gets HA with NTG paste) & increased the Morphine PRN dose. Also wrote for Ambien for sleep  With his BP currently ~100, I am reluctant to add BB until his anatomy is defined.  Continue statin & smoking cessation  counseling.  Continue treating URI Sx as you are. We will follow along over the weekend.   ___________________________________________________________________  HPI: This is a 51 y.o. male with a past medical history significant for thoracic aortic aneurysm status post dacryon graft replacement 1996, renal artery stenosis status post stenting in 2012 as well as hypertension hyperlipidemia and chronic smoking who was in the emergency room at the recommendation of his primary physician for intermittent chest pain lasting since Monday (1/21). To the admitting physician he noted the pain was sharp pain in his left chest radiating to his left shoulder. To me he describes it as a heaviness in his chest. Monday he felt as though he was developing a cold and felt that portable heaviness in his chest.  Then it would come and go and last up to 20 minutes this I was not so she was short of breath. Today he felt better with having less frequently but Wednesday came back more significantly associated with shortness of breath and nausea. He denies any coughing or wheezing, but when he went to go see his primary physician, she noted wheezing and started him on antibiotics and steroids which had little help. By the time he arrived at her office his symptoms abated. He took today off on Thursday but continued to have intermittent symptoms they said would last up to 20 minutes At a time would come, and never truly go away. He awoke Friday morning around 3:00 with this pain and shortness of breath  and it radiated to his left shoulder. After this away he went back to sleep however when he got up to go to his car to drive to work, he had recurrence of symptoms which prompted her to call her physician. She recommended they called the 911, and he was brought to the emergency room. He is being admitted to [facility],  And we were contacted to assist in evaluation and management of his symptoms.  Cardiovascular ROS:  positive for - chest pain, dyspnea on exertion, paroxysmal nocturnal dyspnea and shortness of breath negative for - edema, irregular heartbeat, loss of consciousness, murmur, orthopnea, palpitations or rapid heart rate  ECG - no dynamic ischemic changes, just ST flattening  Troponin - neg x 2.   PMHx:  Past Medical History  Diagnosis Date  . Asthma   . Hypertension   . Hyperlipidemia   . Anxiety   . Tobacco abuse   . Peripheral arterial occlusive disease 2012    s/p PTCA/stent - renal artery stent (in [facility] )  . Coronary artery disease -- this is not documented. He has no recollection of any diagnosis of coronary disease    . Mental disorder     histronic personality disorder   Past Surgical History  Procedure Date  . Appendectomy   . Cholecystectomy   .  Thoracic Aortic Aneurysm Repair - with Dacron graft  1996  . Renal artery stents    FAMHx: Family History  Problem Relation Age of Onset  . Diabetes Mother   . Hypertension Father     SOCHx:  reports that he has been smoking Cigarettes.  He has a 3.75 pack-year smoking history. He has never used smokeless tobacco. He reports that he drinks alcohol. He reports that he does not use illicit drugs.  He works at [employer] as a Production designer, theatre/television/film.  ALLERGIES: Allergies  Allergen Reactions  . Penicillins Itching  . Toradol Swelling    Tongue swells    ROS: Pertinent items are noted in HPI. Review of Systems - General ROS: positive for  - chills and sleep disturbance negative for - fever, hot flashes, malaise or night sweats Psychological ROS: positive for - anxiety, sleep disturbances and (reported h/o Histrionic Personality Disorder) negative for - concentration difficulties, depression, irritability or suicidal ideation Ophthalmic ROS: positive for - uses glasses negative for - blurry vision, double vision, loss of vision, photophobia or scotomata Allergy and Immunology ROS: positive for - nasal  congestion negative for - hives Respiratory ROS: positive for - shortness of breath and wheezing negative for - cough, hemoptysis, orthopnea or stridor Gastrointestinal ROS: no abdominal pain, change in bowel habits, or black or bloody stools Genito-Urinary ROS: no dysuria, trouble voiding, or hematuria Musculoskeletal ROS: positive for - pain in shoulder - right negative for - gait disturbance, muscular weakness or swelling in leg - bilateral Neurological ROS: no TIA or stroke symptoms  HOME MEDICATIONS: Prescriptions prior to admission  Medication Sig Dispense Refill  . amLODipine (NORVASC) 10 MG tablet Take 10 mg by mouth daily.      Marland Kitchen aspirin 81 MG tablet Take 81 mg by mouth daily.      Marland Kitchen HYDROcodone-acetaminophen (VICODIN) 5-500 MG per tablet Take 1 tablet by mouth every 6 (six) hours as needed. For pain      . levofloxacin (LEVAQUIN) 500 MG tablet Take 500 mg by mouth daily. Started 10/10/11. For 7 days      . losartan (COZAAR) 100 MG tablet Take 100  mg by mouth daily.      . predniSONE (STERAPRED UNI-PAK) 10 MG tablet Take 10-60 mg by mouth daily. 60 mg on day 1, then reduce by 1 tablet each day until gone        HOSPITAL MEDICATIONS: Current Facility-Administered Medications  Medication Dose Route Frequency Provider Last Rate Last Dose  . 0.9 %  sodium chloride infusion  250 mL Intravenous PRN Baltazar Najjar, MD      . albuterol (PROVENTIL) (5 MG/ML) 0.5% nebulizer solution 2.5 mg  2.5 mg Nebulization Q4H PRN Baltazar Najjar, MD      . albuterol (PROVENTIL) (5 MG/ML) 0.5% nebulizer solution 5 mg  5 mg Nebulization Once Otilio Miu, PA   5 mg at 10/12/11 1238  . amLODipine (NORVASC) tablet 10 mg  10 mg Oral Daily Baltazar Najjar, MD      . enoxaparin (LOVENOX) injection 40 mg  40 mg Subcutaneous Q24H Baltazar Najjar, MD   40 mg at 10/12/11 2211  . HYDROcodone-acetaminophen (NORCO) 5-325 MG per tablet 1 tablet  1 tablet Oral Q6H PRN Baltazar Najjar, MD   1 tablet at 10/12/11  1848  . iohexol (OMNIPAQUE) 350 MG/ML injection 50 mL  50 mL Intravenous Once PRN Medication Radiologist, MD   50 mL at 10/12/11 1526  . ipratropium (ATROVENT) nebulizer solution 0.5 mg  0.5 mg Nebulization Once Otilio Miu, PA   0.5 mg at 10/12/11 1238  . ipratropium (ATROVENT) nebulizer solution 0.5 mg  0.5 mg Nebulization Q6H PRN Baltazar Najjar, MD      . isosorbide mononitrate (IMDUR) 24 hr tablet 30 mg  30 mg Oral Daily Marykay Lex, MD      . levofloxacin [facility]) tablet 500 mg  500 mg Oral Daily Baltazar Najjar, MD   500 mg at 10/12/11 2211  . LORazepam (ATIVAN) injection 0.5 mg  0.5 mg Intravenous Q6H PRN Baltazar Najjar, MD   0.5 mg at 10/12/11 2042  . losartan (COZAAR) tablet 100 mg  100 mg Oral Daily Baltazar Najjar, MD   100 mg at 10/12/11 2211  . morphine 2 MG/ML injection 2 mg  2 mg Intravenous Once Otilio Miu, PA   2 mg at 10/12/11 1409  . morphine 2 MG/ML injection 2 mg  2 mg Intravenous Once Cyndra Numbers, MD   2 mg at 10/12/11 1747  . morphine 2 MG/ML injection 2 mg  2 mg Intravenous Q4H PRN Marykay Lex, MD      . morphine 4 MG/ML injection 4 mg  4 mg Intravenous Once Otilio Miu, PA   4 mg at 10/12/11 1225  . nicotine (NICODERM CQ - dosed in mg/24 hours) patch 14 mg  14 mg Transdermal Daily Baltazar Najjar, MD   14 mg at 10/12/11 2210  . nitroGLYCERIN (NITROGLYN) 2 % ointment           . nitroGLYCERIN (NITROSTAT) SL tablet 0.4 mg  0.4 mg Sublingual Q5 min PRN Baltazar Najjar, MD      . ondansetron [facility]) injection 4 mg  4 mg Intravenous Once Otilio Miu, PA   4 mg at 10/12/11 1409  . ondansetron (ZOFRAN) injection 4 mg  4 mg Intravenous Once [employer], PA      . ondansetron (ZOFRAN) injection 4 mg  4 mg Intravenous Q6H PRN Baltazar Najjar, MD      . predniSONE (STERAPRED UNI-PAK) tablet 10-60 mg  10-60 mg Oral Daily Baltazar Najjar, MD      .  rosuvastatin (CRESTOR) tablet 5 mg  5 mg Oral Daily Baltazar Najjar, MD   5 mg  at 10/12/11 2210  . sodium chloride 0.9 % injection 3 mL  3 mL Intravenous Q12H Baltazar Najjar, MD   3 mL at 10/12/11 2215  . sodium chloride 0.9 % injection 3 mL  3 mL Intravenous PRN Baltazar Najjar, MD      . zolpidem (AMBIEN) tablet 5 mg  5 mg Oral QHS PRN Marykay Lex, MD   5 mg at 10/13/11 0056  . DISCONTD: aspirin tablet 81 mg  81 mg Oral Daily Baltazar Najjar, MD      . DISCONTD: diphenhydrAMINE (BENADRYL) capsule 25 mg  25 mg Oral Once Otilio Miu, PA      . DISCONTD: morphine 2 MG/ML injection 1 mg  1 mg Intravenous Q4H PRN Baltazar Najjar, MD   1 mg at 10/12/11 2235   VITALS: Blood pressure 99/62, pulse 64, temperature 97.8 F (36.6 C), temperature source Oral, resp. rate 18, SpO2 98.00%.  PHYSICAL EXAM: benign, ? Some reproducibility of location of pain, but not clear. Pain L upper chest --> to throat & L shoulder  General appearance: alert, cooperative, appears stated age, mild distress and anxious Neck: no adenopathy, no carotid bruit, no JVD, supple, symmetrical, trachea midline, thyroid not enlarged, symmetric, no tenderness/mass/nodules and thick -- webbed appearance Lungs: clear to auscultation bilaterally, normal percussion bilaterally and fine R basal rhonchi Heart: regular rate and rhythm, S1, S2 normal, no murmur, click, rub or gallop Abdomen: soft, non-tender; bowel sounds normal; no masses,  no organomegaly Extremities: extremities normal, atraumatic, no cyanosis or edema Pulses: 2+ and symmetric Neurologic: Alert and oriented X 3, normal strength and tone. Normal symmetric reflexes. Normal coordination and gait; CN II-XII grossly intact  LABS: Results for orders placed during the hospital encounter of 10/12/11 (from the past 48 hour(s))  CBC     Status: Normal   Collection Time   10/12/11 12:53 PM      Component Value Range Comment   WBC 10.3  4.0 - 10.5 (K/uL)    RBC 4.71  4.22 - 5.81 (MIL/uL)    Hemoglobin 14.0  13.0 - 17.0 (g/dL)    HCT 11.9  14.7  - 82.9 (%)    MCV 87.0  78.0 - 100.0 (fL)    MCH 29.7  26.0 - 34.0 (pg)    MCHC 34.1  30.0 - 36.0 (g/dL)    RDW 56.2  13.0 - 86.5 (%)    Platelets 300  150 - 400 (K/uL)   DIFFERENTIAL     Status: Normal   Collection Time   10/12/11 12:53 PM      Component Value Range Comment   Neutrophils Relative 73  43 - 77 (%)    Neutro Abs 7.5  1.7 - 7.7 (K/uL)    Lymphocytes Relative 20  12 - 46 (%)    Lymphs Abs 2.1  0.7 - 4.0 (K/uL)    Monocytes Relative 6  3 - 12 (%)    Monocytes Absolute 0.7  0.1 - 1.0 (K/uL)    Eosinophils Relative 0  0 - 5 (%)    Eosinophils Absolute 0.0  0.0 - 0.7 (K/uL)    Basophils Relative 0  0 - 1 (%)    Basophils Absolute 0.0  0.0 - 0.1 (K/uL)   BASIC METABOLIC PANEL     Status: Abnormal   Collection Time   10/12/11 12:53 PM  Component Value Range Comment   Sodium 139  135 - 145 (mEq/L)    Potassium 3.6  3.5 - 5.1 (mEq/L)    Chloride 103  96 - 112 (mEq/L)    CO2 26  19 - 32 (mEq/L)    Glucose, Bld 107 (*) 70 - 99 (mg/dL)    BUN 10  6 - 23 (mg/dL)    Creatinine, Ser 4.78  0.50 - 1.35 (mg/dL)    Calcium 8.9  8.4 - 10.5 (mg/dL)    GFR calc non Af Amer >90  >90 (mL/min)    GFR calc Af Amer >90  >90 (mL/min)   TROPONIN I     Status: Normal   Collection Time   10/12/11 12:53 PM      Component Value Range Comment   Troponin I <0.30  <0.30 (ng/mL)   D-DIMER, QUANTITATIVE     Status: Abnormal   Collection Time   10/12/11 12:53 PM      Component Value Range Comment   D-Dimer, Quant 0.74 (*) 0.00 - 0.48 (ug/mL-FEU)   TROPONIN I     Status: Normal   Collection Time   10/12/11  3:23 PM      Component Value Range Comment   Troponin I <0.30  <0.30 (ng/mL)   CARDIAC PANEL(CRET KIN+CKTOT+MB+TROPI)     Status: Normal   Collection Time   10/12/11  7:04 PM      Component Value Range Comment   Total CK 62  7 - 232 (U/L)    CK, MB 1.7  0.3 - 4.0 (ng/mL)    Troponin I <0.30  <0.30 (ng/mL)    Relative Index RELATIVE INDEX IS INVALID  0.0 - 2.5      IMAGING: Dg Chest  2 View  10/12/2011  *RADIOLOGY REPORT*  Clinical Data: Shortness of breath  CHEST - 2 VIEW  Comparison: None.  Findings: Prior median sternotomy.  Cardiomegaly.  No confluent airspace opacities, effusions or edema.  No acute bony abnormality.  IMPRESSION: Cardiomegaly.  No active disease.  Original Report Authenticated By: Cyndie Chime, M.D.   Ct Angio Chest W/cm &/or Wo Cm  10/12/2011  *RADIOLOGY REPORT*  Clinical Data: Chest pain past 3 days.  Prior CABG.  Smoker. Cough.  Asthma. Shortness of breath.  CT ANGIOGRAPHY CHEST  Technique:  Multidetector CT imaging of the chest using the standard protocol during bolus administration of intravenous contrast. Multiplanar reconstructed images including MIPs were obtained and reviewed to evaluate the vascular anatomy.  Contrast: 50mL OMNIPAQUE IOHEXOL 350 MG/ML IV SOLN  Comparison: 10/12/2011 chest x-ray.  No earlier exams.  Findings: Artifact extends thru the pulmonary arteries.  No pulmonary embolus noted.  Prominence of the main pulmonary arteries may be related to component of pulmonary hypertension or possibly congenital in origin.  Post CABG.  Proximal right coronary artery calcifications.  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  No evidence of dissection.  Minimal atelectatic changes/linear scarring without focal worrisome lung mass.  Trachea and mainstem bronchi are patent.  No mediastinal, hilar or axillary adenopathy.  No bony destructive lesion.  3 cm liver cyst.  Post cholecystectomy.  Remainder of  visualized upper abdominal structures unremarkable.  IMPRESSION: Artifact extends thru the pulmonary arteries.  No pulmonary embolus noted.  Prominence of the main pulmonary arteries may be related to component of pulmonary hypertension or possibly congenital in origin.  Post CABG.  Proximal right coronary  artery calcifications.  Atherosclerotic type changes of the thoracic  aorta with ectasia of the ascending thoracic aorta which measures up to 4.1 cm.  Original Report Authenticated By: Fuller Canada, M.D.    Time Spent Directly with Patient: 30 minutes   HARDING,DAVID W, M.D., M.S. [facility] [address] [place], [place]  [address]  [phone]  10/13/2011 2:24 AM

## 2011-10-13 NOTE — Progress Notes (Signed)
Subjective: Still c/o left side chest pain which is worse when taking a deep breath.     Physical Exam: Blood pressure 97/64, pulse 73, temperature 98.9 F (37.2 C), temperature source Oral, resp. rate 18, height 5\' 11"  (1.803 m), weight 98.975 kg (218 lb 3.2 oz), SpO2 97.00%.  Temp:  [97.5 F (36.4 C)-98.9 F (37.2 C)] 98.9 F (37.2 C) (01/26 1400) Pulse Rate:  [63-85] 73  (01/26 1400) Resp:  [14-24] 18  (01/26 1400) BP: (97-135)/(62-78) 97/64 mmHg (01/26 1400) SpO2:  [96 %-99 %] 97 % (01/26 1400) Weight:  [98.975 kg (218 lb 3.2 oz)] 98.975 kg (218 lb 3.2 oz) (01/26 0500)  Alert and oriented x3 CVS: RRR RS: CTAB Abdomen : soft, NT   Investigations:  No results found for this or any previous visit (from the past 240 hour(s)).   Basic Metabolic Panel:  Basename 10/13/11 0630 10/12/11 1253  NA 137 139  K 4.9 3.6  CL 104 103  CO2 27 26  GLUCOSE 129* 107*  BUN 10 10  CREATININE 0.86 0.84  CALCIUM 8.3* 8.9  MG -- --  PHOS -- --   Liver Function Tests:  Basename 10/10/11 1734  AST 25  ALT 21  ALKPHOS 138*  BILITOT 1.0  PROT 7.7  ALBUMIN 4.6     CBC:  Basename 10/13/11 0630 10/12/11 1253 10/10/11 1734  WBC 6.1 10.3 --  NEUTROABS -- 7.5 7.9*  HGB 12.7* 14.0 --  HCT 38.3* 41.0 --  MCV 89.3 87.0 --  PLT 280 300 --   EKG: NSR, no ST changes    Dg Chest 2 View  10/12/2011  *RADIOLOGY REPORT*  Clinical Data: Shortness of breath  CHEST - 2 VIEW  Comparison: None.  Findings: Prior median sternotomy.  Cardiomegaly.  No confluent airspace opacities, effusions or edema.  No acute bony abnormality.  IMPRESSION: Cardiomegaly.  No active disease.  Original Report Authenticated By: Cyndie Chime, M.D.   Ct Angio Chest W/cm &/or Wo Cm  10/12/2011  *RADIOLOGY REPORT*  Clinical Data: Chest pain past 3 days.  Prior CABG.  Smoker. Cough.  Asthma. Shortness of breath.  CT ANGIOGRAPHY CHEST  Technique:  Multidetector CT imaging of the chest using the standard protocol  during bolus administration of intravenous contrast. Multiplanar reconstructed images including MIPs were obtained and reviewed to evaluate the vascular anatomy.  Contrast: 50mL OMNIPAQUE IOHEXOL 350 MG/ML IV SOLN  Comparison: 10/12/2011 chest x-ray.  No earlier exams.  Findings: Artifact extends thru the pulmonary arteries.  No pulmonary embolus noted.  Prominence of the main pulmonary arteries may be related to component of pulmonary hypertension or possibly congenital in origin.  Post CABG.  Proximal right coronary artery calcifications.  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  No evidence of dissection.  Minimal atelectatic changes/linear scarring without focal worrisome lung mass.  Trachea and mainstem bronchi are patent.  No mediastinal, hilar or axillary adenopathy.  No bony destructive lesion.  3 cm liver cyst.  Post cholecystectomy.  Remainder of  visualized upper abdominal structures unremarkable.  IMPRESSION: Artifact extends thru the pulmonary arteries.  No pulmonary embolus noted.  Prominence of the main pulmonary arteries may be related to component of pulmonary hypertension or possibly congenital in origin.  Post CABG.  Proximal right coronary artery calcifications.  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  Original Report Authenticated By: Fuller Canada, M.D.      Medications:  Scheduled:    . amLODipine  10 mg Oral Daily  . enoxaparin  40 mg Subcutaneous Q24H  . isosorbide mononitrate  30 mg Oral Daily  . levofloxacin  500 mg Oral Daily  . losartan  100 mg Oral Daily  .  morphine injection  2 mg Intravenous Once  . nicotine  14 mg Transdermal Daily  . nitroGLYCERIN      . ondansetron (ZOFRAN) IV  4 mg Intravenous Once  . predniSONE  10-60 mg Oral Daily  . rosuvastatin  5 mg Oral Daily  . sodium chloride  3 mL  Intravenous Q12H  . DISCONTD: aspirin  81 mg Oral Daily   Continuous:  WUJ:WJXBJY chloride, acetaminophen, albuterol, HYDROcodone-acetaminophen, ipratropium, LORazepam, morphine injection, nitroGLYCERIN, ondansetron, sodium chloride, zolpidem, DISCONTD:  morphine injection, DISCONTD:  morphine injection  Impression:  Principal Problem:  *Chest pain Active Problems:  COPD with acute exacerbation  Hypertension  Tobacco abuse  Hyperlipidemia  Hx of thoracic aortic aneurysm repair  To me the chest pain sounds pleuritic in nature    Plan: Continue to monitor Cycle CE Await outside records and cath  Tylenol and Morphine prn      LOS: 1 day   Eloni Darius, MD Pager: [phone] 10/13/2011, 4:40 PM

## 2011-10-13 NOTE — Progress Notes (Signed)
Pt c/o CP; VS stable; 2mg  Morphine Sulfate and 1 SL Ntg given; Dr. Lavera Guise notified.  Dr. Lavera Guise stated that there is no need to give any more SL Ntg due to the fact that the CP is probably not cardiac related.  EKG and Card enzymes have been WNL.  Will continue to monitor pt. Cardiology is also following pt.

## 2011-10-14 MED ORDER — DIAZEPAM 5 MG PO TABS
5.0000 mg | ORAL_TABLET | ORAL | Status: AC
  Administered 2011-10-15: 5 mg via ORAL
  Filled 2011-10-14: qty 1

## 2011-10-14 MED ORDER — HYDROCORTISONE SOD SUCCINATE 100 MG IJ SOLR
25.0000 mg | Freq: Two times a day (BID) | INTRAMUSCULAR | Status: DC
  Administered 2011-10-14 – 2011-10-15 (×3): 25 mg via INTRAVENOUS
  Filled 2011-10-14 (×4): qty 0.5

## 2011-10-14 MED ORDER — SODIUM CHLORIDE 0.9 % IV SOLN: 250.0000 mL | INTRAVENOUS | Status: DC | PRN

## 2011-10-14 MED ORDER — SODIUM CHLORIDE 0.9 % IV SOLN
1.0000 mL/kg/h | INTRAVENOUS | Status: DC
  Administered 2011-10-15: 1 mL/kg/h via INTRAVENOUS

## 2011-10-14 MED ORDER — MENTHOL 3 MG MT LOZG
1.0000 | LOZENGE | OROMUCOSAL | Status: DC | PRN
  Administered 2011-10-15: 3 mg via ORAL
  Filled 2011-10-14: qty 9

## 2011-10-14 MED ORDER — ASPIRIN 81 MG PO CHEW
324.0000 mg | CHEWABLE_TABLET | ORAL | Status: AC
  Administered 2011-10-15: 324 mg via ORAL
  Filled 2011-10-14: qty 4

## 2011-10-14 MED ORDER — SODIUM CHLORIDE 0.9 % IJ SOLN: 3.0000 mL | Freq: Two times a day (BID) | INTRAMUSCULAR | Status: DC

## 2011-10-14 MED ORDER — SODIUM CHLORIDE 0.9 % IJ SOLN: 3.0000 mL | INTRAMUSCULAR | Status: DC | PRN

## 2011-10-14 NOTE — Progress Notes (Signed)
2D Echo completed.  Raye Slyter Johnson, RDCS 

## 2011-10-14 NOTE — Progress Notes (Signed)
[facility]  Subjective: Still with intermittent chest pains More concerned with difficulty swallowing now.  Objective: Vital signs in last 24 hours: Temp:  [98.5 F (36.9 C)-98.9 F (37.2 C)] 98.5 F (36.9 C) (01/27 0600) Pulse Rate:  [66-87] 66  (01/27 0600) Resp:  [16-22] 16  (01/27 0600) BP: (97-115)/(63-75) 115/68 mmHg (01/27 0600) SpO2:  [97 %-98 %] 98 % (01/27 0600) Last BM Date: 10/12/11  Intake/Output from previous day: 01/26 0701 - 01/27 0700 In: 960 [P.O.:960] Out: 600 [Urine:600] Intake/Output this shift: Total I/O In: -  Out: 260 [Urine:260]  Medications Current Facility-Administered Medications  Medication Dose Route Frequency Provider Last Rate Last Dose  . 0.9 %  sodium chloride infusion  250 mL Intravenous PRN Baltazar Najjar, MD      . acetaminophen (TYLENOL) tablet 650 mg  650 mg Oral Q6H PRN Sorin Lavera Guise, MD      . albuterol (PROVENTIL) (5 MG/ML) 0.5% nebulizer solution 2.5 mg  2.5 mg Nebulization Q4H PRN Baltazar Najjar, MD      . amLODipine (NORVASC) tablet 10 mg  10 mg Oral Daily Baltazar Najjar, MD   10 mg at 10/13/11 1002  . enoxaparin (LOVENOX) injection 40 mg  40 mg Subcutaneous Q24H Baltazar Najjar, MD   40 mg at 10/13/11 1854  . hydrocortisone sodium succinate (SOLU-CORTEF) injection 25 mg  25 mg Intravenous Q12H Sorin Lavera Guise, MD      . ipratropium (ATROVENT) nebulizer solution 0.5 mg  0.5 mg Nebulization Q6H PRN Baltazar Najjar, MD      . isosorbide mononitrate (IMDUR) 24 hr tablet 30 mg  30 mg Oral Daily Marykay Lex, MD   30 mg at 10/13/11 1002  . levofloxacin (LEVAQUIN) tablet 500 mg  500 mg Oral Daily Baltazar Najjar, MD   500 mg at 10/13/11 1002  . LORazepam (ATIVAN) injection 0.5 mg  0.5 mg Intravenous Q6H PRN Baltazar Najjar, MD   0.5 mg at 10/13/11 1715  . losartan (COZAAR) tablet 100 mg  100 mg Oral Daily Baltazar Najjar, MD   100 mg at 10/13/11 1002  . menthol-cetylpyridinium (CEPACOL) lozenge 3 mg  1 lozenge Oral  PRN Sorin Laza, MD      . morphine 2 MG/ML injection 2 mg  2 mg Intravenous Q2H PRN Sorin Lavera Guise, MD   2 mg at 10/14/11 0844  . nicotine (NICODERM CQ - dosed in mg/24 hours) patch 14 mg  14 mg Transdermal Daily Baltazar Najjar, MD   14 mg at 10/13/11 1001  . nitroGLYCERIN (NITROSTAT) SL tablet 0.4 mg  0.4 mg Sublingual Q5 min PRN Baltazar Najjar, MD   0.4 mg at 10/13/11 1012  . ondansetron (ZOFRAN) injection 4 mg  4 mg Intravenous Once [employer], [employer]      . ondansetron (ZOFRAN) injection 4 mg  4 mg Intravenous Q6H PRN Baltazar Najjar, MD      . sodium chloride 0.9 % injection 3 mL  3 mL Intravenous Q12H Baltazar Najjar, MD   3 mL at 10/13/11 2121  . sodium chloride 0.9 % injection 3 mL  3 mL Intravenous PRN Baltazar Najjar, MD      . zolpidem (AMBIEN) tablet 5 mg  5 mg Oral QHS PRN Marykay Lex, MD   5 mg at 10/13/11 0056  . DISCONTD: HYDROcodone-acetaminophen (NORCO) 5-325 MG per tablet 1 tablet  1 tablet Oral Q6H PRN Baltazar Najjar, MD   1 tablet at 10/13/11 2125  . DISCONTD: morphine 2 MG/ML  injection 2 mg  2 mg Intravenous Q4H PRN Marykay Lex, MD   2 mg at 10/13/11 [phone]  . DISCONTD: predniSONE (STERAPRED UNI-PAK) tablet 10-60 mg  10-60 mg Oral Daily Baltazar Najjar, MD   30 mg at 10/13/11 1000  . DISCONTD: rosuvastatin (CRESTOR) tablet 5 mg  5 mg Oral Daily Baltazar Najjar, MD   5 mg at 10/13/11 1001    PE: BP 115/68  Pulse 66  Temp(Src) 98.5 F (36.9 C) (Oral)  Resp 16  Ht 5\' 11"  (1.803 m)  Wt 98.975 kg (218 lb 3.2 oz)  BMI 30.43 kg/m2  SpO2 98% General appearance: alert, cooperative, no distress, mildly obese and very anxious Neck: no adenopathy, no carotid bruit, no JVD, supple, symmetrical, trachea midline, thyroid not enlarged, symmetric, no tenderness/mass/nodules and thick Lungs: clear to auscultation bilaterally, normal percussion bilaterally and non-labored Heart: regular rate and rhythm, S1, S2 normal, no murmur, click, rub or gallop Abdomen: soft, non-tender;  bowel sounds normal; no masses,  no organomegaly Extremities: extremities normal, atraumatic, no cyanosis or edema Pulses: 2+ and symmetric Neurologic: Grossly normal  Lab Results:   Basename 10/13/11 0630 10/12/11 1253  WBC 6.1 10.3  HGB 12.7* 14.0  HCT 38.3* 41.0  PLT 280 300   BMET  Basename 10/13/11 0630 10/12/11 1253  NA 137 139  K 4.9 3.6  CL 104 103  CO2 27 26  GLUCOSE 129* 107*  BUN 10 10  CREATININE 0.86 0.84  CALCIUM 8.3* 8.9   CT ANGIOGRAPHY CHEST  Technique: Multidetector CT imaging of the chest using the  standard protocol during bolus administration of intravenous  contrast. Multiplanar reconstructed images including MIPs were  obtained and reviewed to evaluate the vascular anatomy.  Contrast: 50mL OMNIPAQUE IOHEXOL 350 MG/ML IV SOLN  Comparison: 10/12/2011 chest x-ray. No earlier exams.  Findings: Artifact extends thru the pulmonary arteries. No  pulmonary embolus noted. Prominence of the main pulmonary arteries  may be related to component of pulmonary hypertension or possibly  congenital in origin.  Post CABG (not post CABG). Proximal right coronary artery calcifications.  Atherosclerotic type changes of the thoracic aorta with ectasia of  the ascending thoracic aorta which measures up to 4.1 cm. No  evidence of dissection.  Minimal atelectatic changes/linear scarring without focal worrisome  lung mass. Trachea and mainstem bronchi are patent. No  mediastinal, hilar or axillary adenopathy.  No bony destructive lesion.  3 cm liver cyst. Post cholecystectomy. Remainder of visualized  upper abdominal structures unremarkable.   IMPRESSION:  Artifact extends thru the pulmonary arteries. No pulmonary embolus  noted. Prominence of the main pulmonary arteries may be related to  component of pulmonary hypertension or possibly congenital in  origin.  Post Sternotomy! (not CABG) Proximal right coronary artery calcifications.  Atherosclerotic type changes of  the thoracic aorta with ectasia of  the ascending thoracic aorta which measures up to 4.1 cm.  Assessment/Plan  Principal Problem:  *Chest pain Active Problems:  COPD with acute exacerbation  Hypertension  Tobacco abuse  Hyperlipidemia  Hx of thoracic aortic aneurysm repair  Plan:  Cardiac enzymes negative x four.  No pulmonary embolus by CT angio.    LOS: 2 days    HAGER,BRYAN W 10/14/2011 9:29 AM  I have seen and examined the patient along with Wilburt Finlay, PA.  I have reviewed the chart, notes and new data.  I agree with Bryan's note.  Key new complaints: throat is sore, still having some of the chest discomfort Key  examination changes: normal exam Key new findings / data: CE negative.  Normal labs  PLAN: Based on the CT with calcified RCA & atypical symptoms for angina with significant Cardiac RFs, I feel that the most expeditious way to determine if there is a cardiac cause for his chest discomfort is to pursue invasive cardiac evaluation as opposed to NST.  With the chronicity of his symptoms, he may well return with similar symptoms despite a potentially "negative Cardiolite/Myoview".    We discussed this predicament & he agrees to proceed provided that I am the one performing the procedure.  We will plan for LHC in AM.   CARDIAC CATHETERIZATION CONSENT   Performing MD:  Marykay Lex, M.D., M.S.  Procedure:  Left Heart Catheterization with Coronary Angiography, and Possible ad-hoc Percutaneous Coronary Angiography  The procedure with Risks/Benefits/Alternatives and Indications was reviewed with the patient.  All questions were answered.    Risks / Complications include, but not limited to: Death, MI, CVA/TIA, VF/VT (with defibrillation), Bradycardia (need for temporary pacer placement), contrast induced nephropathy, bleeding / bruising / hematoma / pseudoaneurysm, vascular or coronary injury (with possible emergent CT or Vascular Surgery), adverse medication  reactions, infection.    The patient voices understanding and agree to proceed.      Marykay Lex, M.D., M.S. [facility] [address] [place], [place]  [address]  [phone]  10/14/2011 10:08 AM

## 2011-10-14 NOTE — Progress Notes (Signed)
Subjective: C/o throat feeling different Chest pain is better   Old records could not be obtained - surgery was too long ago  Physical Exam: Blood pressure 115/68, pulse 66, temperature 98.5 F (36.9 C), temperature source Oral, resp. rate 16, height 5\' 11"  (1.803 m), weight 98.975 kg (218 lb 3.2 oz), SpO2 98.00%.  Temp:  [98.5 F (36.9 C)-98.9 F (37.2 C)] 98.5 F (36.9 C) (01/27 0600) Pulse Rate:  [66-87] 66  (01/27 0600) Resp:  [16-22] 16  (01/27 0600) BP: (97-115)/(63-75) 115/68 mmHg (01/27 0600) SpO2:  [97 %-98 %] 98 % (01/27 0600)  Alert and oriented x3 Anxious and nervous Chest : ctab Cor: rrr Abdomen: soft, NT Throat with minimal edema no erythema   Investigations:  No results found for this or any previous visit (from the past 240 hour(s)).   Basic Metabolic Panel:  Basename 10/13/11 0630 10/12/11 1253  NA 137 139  K 4.9 3.6  CL 104 103  CO2 27 26  GLUCOSE 129* 107*  BUN 10 10  CREATININE 0.86 0.84  CALCIUM 8.3* 8.9  MG -- --  PHOS -- --   Liver Function Tests: No results found for this basename: AST:2,ALT:2,ALKPHOS:2,BILITOT:2,PROT:2,ALBUMIN:2 in the last 72 hours   CBC:  Basename 10/13/11 0630 10/12/11 1253  WBC 6.1 10.3  NEUTROABS -- 7.5  HGB 12.7* 14.0  HCT 38.3* 41.0  MCV 89.3 87.0  PLT 280 300   EKG: NSR, no ST changes    Dg Chest 2 View  10/12/2011  *RADIOLOGY REPORT*  Clinical Data: Shortness of breath  CHEST - 2 VIEW  Comparison: None.  Findings: Prior median sternotomy.  Cardiomegaly.  No confluent airspace opacities, effusions or edema.  No acute bony abnormality.  IMPRESSION: Cardiomegaly.  No active disease.  Original Report Authenticated By: Cyndie Chime, M.D.   Ct Angio Chest W/cm &/or Wo Cm  10/12/2011  *RADIOLOGY REPORT*  Clinical Data: Chest pain past 3 days.  Prior CABG.  Smoker. Cough.  Asthma. Shortness of breath.  CT ANGIOGRAPHY CHEST  Technique:  Multidetector CT imaging of the chest using the standard protocol during  bolus administration of intravenous contrast. Multiplanar reconstructed images including MIPs were obtained and reviewed to evaluate the vascular anatomy.  Contrast: 50mL OMNIPAQUE IOHEXOL 350 MG/ML IV SOLN  Comparison: 10/12/2011 chest x-ray.  No earlier exams.  Findings: Artifact extends thru the pulmonary arteries.  No pulmonary embolus noted.  Prominence of the main pulmonary arteries may be related to component of pulmonary hypertension or possibly congenital in origin.  Post CABG.  Proximal right coronary artery calcifications.  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  No evidence of dissection.  Minimal atelectatic changes/linear scarring without focal worrisome lung mass.  Trachea and mainstem bronchi are patent.  No mediastinal, hilar or axillary adenopathy.  No bony destructive lesion.  3 cm liver cyst.  Post cholecystectomy.  Remainder of  visualized upper abdominal structures unremarkable.  IMPRESSION: Artifact extends thru the pulmonary arteries.  No pulmonary embolus noted.  Prominence of the main pulmonary arteries may be related to component of pulmonary hypertension or possibly congenital in origin.  Post CABG.  Proximal right coronary artery calcifications.  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  Original Report Authenticated By: Fuller Canada, M.D.   Results for ODELL, CHOUNG (MRN [id number]) as of 10/14/2011 08:12  Ref. Range 10/12/2011 15:23 10/12/2011 15:24 10/12/2011 19:04 10/13/2011 06:30 10/13/2011 16:56  CK, MB Latest Range: 0.3-4.0 ng/mL   1.7  1.6  CK Total Latest Range: 7-232 U/L   62  78  Troponin I Latest Range: <0.30 ng/mL <0.30  <0.30  <0.30     Medications:  Scheduled:    . amLODipine  10 mg Oral Daily  . enoxaparin  40 mg Subcutaneous Q24H  . isosorbide mononitrate  30 mg Oral Daily  .  levofloxacin  500 mg Oral Daily  . losartan  100 mg Oral Daily  . nicotine  14 mg Transdermal Daily  . ondansetron (ZOFRAN) IV  4 mg Intravenous Once  . predniSONE  10-60 mg Oral Daily  . rosuvastatin  5 mg Oral Daily  . sodium chloride  3 mL Intravenous Q12H   Continuous:  YQM:VHQION chloride, acetaminophen, albuterol, HYDROcodone-acetaminophen, ipratropium, LORazepam, morphine injection, nitroGLYCERIN, ondansetron, sodium chloride, zolpidem, DISCONTD:  morphine injection  Impression: New problem today of possible angioedema - unclear which moght be the culprit medication. He took crestor and vicodin before going to bed - so we will DC those 2 meds  Principal Problem:  *Chest pain Active Problems:  COPD with acute exacerbation  Hypertension  Tobacco abuse  Hyperlipidemia  Hx of thoracic aortic aneurysm repair  To me the chest pain sounds pleuritic in nature  3 sets of CE wnl  Plan: - iv steroids Monitor in hospital       LOS: 2 days   Laiden Milles, MD Pager: [phone] 10/14/2011, 8:12 AM

## 2011-10-14 NOTE — Progress Notes (Signed)
Patient Bradley Prince, 51 year old white male feels anxiety about his heart procedure tomorrow morning.  But he feels encouraged by the strong emotional support of his wife. Chaplain provided pastoral presence, prayer, and conversation.  Patient thanked Chaplain for his presence. For follow-up, I will refer patient to the Chaplain for unit 3700.

## 2011-10-14 NOTE — Progress Notes (Signed)
[facility] called this am to inform us that they did not have any Cath/Surgical records for pt.  Only records they have are from a knee surgery.  They only go back as far as 1995. Dr. Lavera Guise notified.

## 2011-10-15 ENCOUNTER — Other Ambulatory Visit: Payer: Self-pay

## 2011-10-15 ENCOUNTER — Inpatient Hospital Stay (HOSPITAL_COMMUNITY): Payer: BC Managed Care – PPO

## 2011-10-15 ENCOUNTER — Encounter (HOSPITAL_COMMUNITY)
Admission: EM | Disposition: A | Discharge status: Home / Self Care | Payer: Self-pay | Source: Home / Self Care | Attending: Internal Medicine

## 2011-10-15 HISTORY — PX: LEFT HEART CATHETERIZATION WITH CORONARY ANGIOGRAM: SHX5451

## 2011-10-15 LAB — CBC
HCT: 38.7 % — ABNORMAL LOW (ref 39.0–52.0)
Hemoglobin: 12.4 g/dL — ABNORMAL LOW (ref 13.0–17.0)
MCH: 28.8 pg (ref 26.0–34.0)
RBC: 4.31 MIL/uL (ref 4.22–5.81)

## 2011-10-15 LAB — PROTIME-INR
INR: 1.05 (ref 0.00–1.49)
Prothrombin Time: 13.9 seconds (ref 11.6–15.2)

## 2011-10-15 LAB — GLUCOSE, CAPILLARY: Glucose-Capillary: 77 mg/dL (ref 70–99)

## 2011-10-15 SURGERY — LEFT HEART CATHETERIZATION WITH CORONARY ANGIOGRAM
Anesthesia: LOCAL

## 2011-10-15 MED ORDER — NALOXONE HCL 0.4 MG/ML IJ SOLN
INTRAMUSCULAR | Status: AC
  Filled 2011-10-15: qty 1

## 2011-10-15 MED ORDER — FLUMAZENIL 1 MG/10ML IV SOLN
0.5000 mg | Freq: Once | INTRAVENOUS | Status: AC
  Administered 2011-10-15: 0.5 mg via INTRAVENOUS

## 2011-10-15 MED ORDER — FLUMAZENIL 1 MG/10ML IV SOLN
0.5000 mg | Freq: Once | INTRAVENOUS | Status: AC
  Administered 2011-10-15: 0.5 mg via INTRAVENOUS
  Filled 2011-10-15: qty 5

## 2011-10-15 MED ORDER — SODIUM CHLORIDE 0.9 % IJ SOLN: 3.0000 mL | INTRAMUSCULAR | Status: DC | PRN

## 2011-10-15 MED ORDER — SODIUM CHLORIDE 0.9 % IV SOLN
1.0000 mL/kg/h | INTRAVENOUS | Status: AC
  Administered 2011-10-15: 1 mL/kg/h via INTRAVENOUS

## 2011-10-15 MED ORDER — MIDAZOLAM HCL 2 MG/2ML IJ SOLN
INTRAMUSCULAR | Status: AC
  Filled 2011-10-15: qty 2

## 2011-10-15 MED ORDER — HYDRALAZINE HCL 20 MG/ML IJ SOLN
INTRAMUSCULAR | Status: AC
  Filled 2011-10-15: qty 1

## 2011-10-15 MED ORDER — HEPARIN (PORCINE) IN NACL 2-0.9 UNIT/ML-% IJ SOLN
INTRAMUSCULAR | Status: AC
  Filled 2011-10-15: qty 2000

## 2011-10-15 MED ORDER — ACETAMINOPHEN 650 MG RE SUPP: 650.0000 mg | RECTAL | Status: DC | PRN

## 2011-10-15 MED ORDER — LIDOCAINE HCL (PF) 1 % IJ SOLN
INTRAMUSCULAR | Status: AC
  Filled 2011-10-15: qty 30

## 2011-10-15 MED ORDER — HYDRALAZINE HCL 20 MG/ML IJ SOLN
10.0000 mg | INTRAMUSCULAR | Status: DC | PRN
  Filled 2011-10-15: qty 0.5

## 2011-10-15 MED ORDER — HYDRALAZINE HCL 20 MG/ML IJ SOLN
5.0000 mg | Freq: Once | INTRAMUSCULAR | Status: AC
  Administered 2011-10-15: 5 mg via INTRAVENOUS

## 2011-10-15 MED ORDER — SODIUM CHLORIDE 0.9 % IJ SOLN
3.0000 mL | Freq: Two times a day (BID) | INTRAMUSCULAR | Status: DC
  Administered 2011-10-15 – 2011-10-16 (×2): 3 mL via INTRAVENOUS

## 2011-10-15 MED ORDER — MORPHINE SULFATE 2 MG/ML IJ SOLN
1.0000 mg | INTRAMUSCULAR | Status: DC | PRN
  Administered 2011-10-15 (×2): 1 mg via INTRAVENOUS
  Filled 2011-10-15 (×2): qty 1

## 2011-10-15 MED ORDER — SODIUM CHLORIDE 0.9 % IV SOLN: 250.0000 mL | INTRAVENOUS | Status: DC | PRN

## 2011-10-15 MED ORDER — FLUMAZENIL 0.5 MG/5ML IV SOLN
INTRAVENOUS | Status: AC
  Filled 2011-10-15: qty 5

## 2011-10-15 MED ORDER — FENTANYL CITRATE 0.05 MG/ML IJ SOLN
INTRAMUSCULAR | Status: AC
  Filled 2011-10-15: qty 2

## 2011-10-15 MED ORDER — NITROGLYCERIN 0.2 MG/ML ON CALL CATH LAB
INTRAVENOUS | Status: AC
  Filled 2011-10-15: qty 1

## 2011-10-15 MED ORDER — NALOXONE HCL 0.4 MG/ML IJ SOLN
0.4000 mg | Freq: Once | INTRAMUSCULAR | Status: AC
  Administered 2011-10-15: 0.4 mg via INTRAVENOUS

## 2011-10-15 NOTE — Progress Notes (Signed)
Utilization Review Completed.Bradley Prince T1/28/2013   

## 2011-10-15 NOTE — Op Note (Signed)
[facility]     CARDIAC CATHETERIZATION REPORT  Bradley Prince   MRN: [id number] 03-Sep-1961   ADMIT DATE:  10/12/2011  Performing Cardiologist: Marykay Lex, MD, MS Primary Physician: Duncan Dull, MD, MD Primary Cardiologist:  Marykay Lex, MD, MS   Procedures Performed:  Left Heart Catheterization via 5 Fr Right Common Femoral Artery access  Left Ventriculography, (RAO) 11 ml/sec for 33 ml total contrast  Native Coronary Angiography   Indication(s): Recurrent Left Sided Chest Pain at rest   HTN, HLD & Smoker  S/p TAA repair  S/p RAS stenting  History: 51 y.o. male a past medical history significant for thoracic aortic aneurysm status post dacryon graft replacement 1996, renal artery stenosis status post stenting in 2012 as well as hypertension hyperlipidemia and chronic smoking who was in the emergency room at the recommendation of his primary physician for intermittent chest pain lasting since Monday (1/21). To the admitting physician he noted the pain was sharp pain in his left chest radiating to his left shoulder. To me he describes it as a heaviness in his chest. Monday he felt as though he was developing a cold and felt that portable heaviness in his chest. Then it would come and go and last up to 20 minutes this I was not so she was short of breath. Today he felt better with having less frequently but Wednesday came back more significantly associated with shortness of breath and nausea. He denies any coughing or wheezing, but when he went to go see his primary physician, she noted wheezing and started him on antibiotics and steroids which had little help. By the time he arrived at her office his symptoms abated. He took today off on Thursday but continued to have intermittent symptoms they said would last up to 20 minutes At a time would come, and never truly go away. He awoke Friday morning around 3:00 with this pain and shortness of breath  and it radiated to his left shoulder. After this away he went back to sleep however when he got up to go to his car to drive to work, he had recurrence of symptoms which prompted her to call her physician. She recommended they called the 911, and he was brought to the emergency room. He is being admitted to [facility]. After consultation, the decision was made to proceed with diagnostic cardiac catheterization based upon his RFs & CTA of chest with ? RCA disease.   Consent: The procedure with Risks/Benefits/Alternatives and Indications was reviewed with the patient.  All questions were answered.    Risks / Complications include, but not limited to: Death, MI, CVA/TIA, VF/VT (with defibrillation), Bradycardia (need for temporary pacer placement), contrast induced nephropathy, bleeding / bruising / hematoma / pseudoaneurysm, vascular or coronary injury (with possible emergent CT or Vascular Surgery), adverse medication reactions, infection.    The patient voiced understanding and agree to proceed.    Consent for signed by MD and patient with RN witness -- placed on chart.  Procedure: The patient was brought to the 2nd Floor [facility] in the fasting state and prepped and draped in the usual sterile fashion for Right groin access. Sterile technique was used including antiseptics, cap, gloves, gown, hand hygiene, mask and sheet.  Skin prep: Chlorhexidine;  Time Out: Verified patient identification, verified procedure, site/side was marked, verified correct patient position, special equipment/implants available, medications/allergies/relevent history reviewed, required imaging and test results available.  Performed  The right  femoral head was identified using tactile and fluoroscopic technique.  The right groin was anesthetized with 1% subcutaneous Lidocaine.  The right Common Femoral Artery was accessed using the Modified Seldinger Technique with placement of a  antimicrobial bonded/coated single lumen (5 Fr) sheath.  The sheath was aspirated and flushed.    A 5 Fr JL4 Catheter was advanced of over a Standard J wire into the ascending Aorta.  The catheter was used to engage the Left Coronary Artery.  Multiple cineangiographic views of the Left Coronary Artery system(s) were performed.   A 5 Fr JR4 Catheter was advanced of over a Standard J   wire into the ascending Aorta.  The catheter was used to engage the Right Coronary Artery.  Multiple cineangiographic views of the Right Coronary Artery system were performed.  This catheter was then exchanged over the standard J wire for an angled Pigtail catheter that was advanced across the Aortic Valve.  LV hemodynamics were measured,and and  andLeft Ventriculography was performed.  LV hemodynamics were then re-sampled, and the catheter was pulled back across the Aortic Valve for measurement of "pull-back" gradient.  The catheter and the wire were removed completely out of the body.  The patient was transferred to the holding area where the sheath was removed with manual pressure held for hemostasis.   I the holding area,the patient became less responsive - with concern for R sided weakness.  1 round of Narcan & 2 doses of Rumazecon were given. Patient is now more arousable & moving all extremities.  Have as asked for Neurology input.  The patient was transported to the PACU in moderately sedated, but hemodynamically stable condition.    The patient  was stable before, during and following the procedure - until arrival to PACU. Patient did tolerate procedure well. There were not complications during the procedure & the procedure went smoothly.  EBL: <44ml  Medications:  Premedication: 5mg   Valium (apparently given IV Ativan @ ~0600)  Sedation:  2 mg IV Versed, 50 IV mcg Fentanyl  Contrast:  75ml Omnipaque   Hemodynamics:  Central Aortic Pressure / Mean Aortic Pressure: 126/75 mmHg, 98 mmhg  LV Pressure /  LV End diastolic Pressure:  120/11 mmHg,  Left Ventriculography:  EF:  60-65%  Wall Motion: Normal  Coronary Angiographic Data: Right Dominant  Left Main:  Large caliber, angiographically normal; bifurcates into LAD, LCx  Left Anterior Descending (LAD):  Moderate-to-large caliber proximal vessel with very proximal small D1; both the ostial/proximal LAD & Diag have ~40% stenosis.  The LAD then essentially bifurcates into equal sized D2 & distal LAD.  D2 has several branches with no significant disease. The Distal LAD tapers to the apex & gives rise to a small D2 from the distal mid vessel.  Circumflex (LCx):  Large caliber, non-dominant vessel, gives rise to a very proximal small OM1 with ~50% ostial stenosis as well as a proximal atrial branch before branching into a bifurcating Moderate caliber OM (superior branch larger & bifurcating) and small caliber AV Groove artery.  No significant disease in the remainder of the circumflex system  Right Coronary Artery:  Large dominant vessel roughly 6 mm proximal has a moderate caliber marginal branch before bifurcating the relatively high bifurcation gives rise to a moderate to large caliber posterior descending artery and posterior atrioventricular groove vessel. No significant CAD this vessel.  The ostium has a somewhat anterior takeoff.  RPDA: Moderate to large caliber vessel, bifurcates the distal portion; minimal luminal irregularities  Right Posterior Atrioventricular Groove Branch: large caliber vessel that gives rise to a small posterior lateral branch followed by bifurcating into 2 larger posterior lateral branches an extensive distribution. No significant disease.  Impression: 1.   Essentially normal coronary arteries these are going moderate disease in the ostial LAD ostial D1 and ostial OM1.  No lesions to explain resting chest pain. 2.  Well-preserved, vigorous Left Ventricular Ejection Fraction with no regional wall motion  abnormalities 3.  Most likely etiology of chest pain is noncardiac.   Plan:   I have asked the neurology team to evaluate for his altered mental status however my associates of this is due to sedation medications as he received significant doses of benzodiazepines and narcotics as responded to reversal agents.  Transfer back to his bed following sheath removal.  Continue risk factor modification with blood pressure and lipid control.  Anticipate discharge based on hospitalist service assessment of noncardiac chest pain.  The case and results was discussed with the patient (and family).  The case and results was discussed with the patient's Cardiologist.  Time Spend Directly with Patient:   30  minutes  HARDING,DAVID W, M.D., M.S. [facility] [address] [place], [place]  [address]  [phone]  10/15/2011 10:09 AM

## 2011-10-15 NOTE — Consult Note (Signed)
Referring Physician: Herbie Baltimore    Chief Complaint: Right sided weakness, decreased responsiveness  HPI: Bradley Prince is an 51 y.o. male who underwent cardiac cath today.  After cath was noted to have altered mental status and a questionable right sided weakness.  Drugs were reversed and the patient's mental status improved.  He continued to have difficulty with the right side and a facial droop.  Consult was called at that time.  It should be noted that the patient had an episode a few months ago when he had right sided weakness.  Was felt to be secondary to stress at that time.  LSN: [id number] Given: No: Improvement in symptoms, unlikely stroke mRankin: 0  Past Medical History  Diagnosis Date  . Asthma   . Hypertension   . Hyperlipidemia   . Anxiety   . Tobacco abuse   . Peripheral arterial occlusive disease 2012    s/p PTCA/stent  . Coronary artery disease   . Mental disorder     histronic personality disorder    Past Surgical History  Procedure Date  . Appendectomy   . Cholecystectomy   . Abdominal aortic aneurysm repair   . Renal artery stents     Family History  Problem Relation Age of Onset  . Diabetes Mother   . Hypertension Father    Social History:  reports that he has been smoking Cigarettes.  He has a 3.75 pack-year smoking history. He has never used smokeless tobacco. He reports that he drinks alcohol. He reports that he does not use illicit drugs.  Allergies:  Allergies  Allergen Reactions  . Penicillins Itching  . Toradol Swelling    Tongue swells    Medications:  I have reviewed the patient's current medications. Scheduled:   . amLODipine  10 mg Oral Daily  . aspirin  324 mg Oral Pre-Cath  . diazepam  5 mg Oral On Call  . fentaNYL      . flumazenil  0.5 mg Intravenous Once  . flumazenil  0.5 mg Intravenous Once  . heparin      . hydrALAZINE  5 mg Intravenous Once  . hydrocortisone sod succinate (SOLU-CORTEF) injection  25 mg Intravenous  Q12H  . levofloxacin  500 mg Oral Daily  . lidocaine      . losartan  100 mg Oral Daily  . midazolam      . naloxone  0.4 mg Intravenous Once  . nicotine  14 mg Transdermal Daily  . nitroGLYCERIN      . sodium chloride  3 mL Intravenous Q12H  . DISCONTD: enoxaparin  40 mg Subcutaneous Q24H  . DISCONTD: isosorbide mononitrate  30 mg Oral Daily  . DISCONTD: ondansetron (ZOFRAN) IV  4 mg Intravenous Once  . DISCONTD: sodium chloride  3 mL Intravenous Q12H  . DISCONTD: sodium chloride  3 mL Intravenous Q12H    ROS: History obtained from the patient  General ROS: negative for - chills, fatigue, fever, night sweats, weight gain or weight loss Psychological ROS: negative for - behavioral disorder, hallucinations, memory difficulties, mood swings or suicidal ideation Ophthalmic ROS: negative for - blurry vision, double vision, eye pain or loss of vision ENT ROS: negative for - epistaxis, nasal discharge, oral lesions, sore throat, tinnitus or vertigo Allergy and Immunology ROS: negative for - hives or itchy/watery eyes Hematological and Lymphatic ROS: negative for - bleeding problems, bruising or swollen lymph nodes Endocrine ROS: negative for - galactorrhea, hair pattern changes, polydipsia/polyuria or temperature intolerance Respiratory  ROS: negative for - cough, hemoptysis, shortness of breath or wheezing Cardiovascular ROS: negative for - chest pain, dyspnea on exertion, edema or irregular heartbeat Gastrointestinal ROS: negative for - abdominal pain, diarrhea, hematemesis, nausea/vomiting or stool incontinence Genito-Urinary ROS: negative for - dysuria, hematuria, incontinence or urinary frequency/urgency Musculoskeletal ROS: negative for - joint swelling or muscular weakness Neurological ROS: as noted in HPI Dermatological ROS: negative for rash and skin lesion changes  Physical Examination: Blood pressure 133/84, pulse 72, temperature 97.6 F (36.4 C), temperature source Oral,  resp. rate 18, height 5\' 11"  (1.803 m), weight 102.105 kg (225 lb 1.6 oz), SpO2 98.00%.  Neurologic Examination: Mental Status: Alert, oriented, thought content appropriate.  Speech fluent without evidence of aphasia.  Able to follow 3 step commands without difficulty. Cranial Nerves: II: visual fields grossly normal, pupils equal, round, reactive to light and accommodation III,IV, VI: ptosis not present, extra-ocular motions intact bilaterally V,VII: mild decrease in right NLF.  Does not give full effort for smile. Facial light touch sensation normal bilaterally VIII: hearing normal bilaterally IX,X: gag reflex present XI: trapezius strength/neck flexion strength normal bilaterally XII: tongue strength normal  Motor: Right : Upper extremity   5/5    Left:     Upper extremity   5/5  Lower extremity   5/5     Lower extremity   5/5 No focal strength loss.  No drift.  Give way weakness noted throughout. Tone and bulk:normal tone throughout; no atrophy noted Sensory: Pinprick and light touch decreased on the right, splitting the midline on the trunk Deep Tendon Reflexes: 2+ and symmetric throughout Plantars: Right: mute   Left: mute Cerebellar: normal finger-to-nose and normal heel-to-shin test  Results for orders placed during the hospital encounter of 10/12/11 (from the past 48 hour(s))  CARDIAC PANEL(CRET KIN+CKTOT+MB+TROPI)     Status: Normal   Collection Time   10/13/11  4:56 PM      Component Value Range Comment   Total CK 78  7 - 232 (U/L)    CK, MB 1.6  0.3 - 4.0 (ng/mL)    Troponin I <0.30  <0.30 (ng/mL)    Relative Index RELATIVE INDEX IS INVALID  0.0 - 2.5    CBC     Status: Abnormal   Collection Time   10/15/11  5:35 AM      Component Value Range Comment   WBC 8.9  4.0 - 10.5 (K/uL)    RBC 4.31  4.22 - 5.81 (MIL/uL)    Hemoglobin 12.4 (*) 13.0 - 17.0 (g/dL)    HCT 11.9 (*) 14.7 - 52.0 (%)    MCV 89.8  78.0 - 100.0 (fL)    MCH 28.8  26.0 - 34.0 (pg)    MCHC 32.0  30.0  - 36.0 (g/dL)    RDW 82.9  56.2 - 13.0 (%)    Platelets 276  150 - 400 (K/uL)   PROTIME-INR     Status: Normal   Collection Time   10/15/11  5:35 AM      Component Value Range Comment   Prothrombin Time 13.9  11.6 - 15.2 (seconds)    INR 1.05  0.00 - 1.49    APTT     Status: Normal   Collection Time   10/15/11  5:35 AM      Component Value Range Comment   aPTT 30  24 - 37 (seconds)   GLUCOSE, CAPILLARY     Status: Normal   Collection Time   10/15/11  9:55 AM      Component Value Range Comment   Glucose-Capillary 77  70 - 99 (mg/dL)    Ct Head Wo Contrast  10/15/2011  *RADIOLOGY REPORT*  Clinical Data: From the cath lab.  Right arm weakness.  CT HEAD WITHOUT CONTRAST  Technique:  Contiguous axial images were obtained from the base of the skull through the vertex without contrast.  Comparison: None.  Findings: Contrast from recent catheterization without obvious intracranial hemorrhage.  No intracranial enhancing lesion.  Asymmetric white matter type changes greater on the left frontal lobe.  This may be related to the small vessel disease type changes although acute infarct is not excluded given the right arm weakness.  MR imaging may prove helpful for further delineation.  Cerebellar tonsils minimally low-lying although within normal limits.  Partially empty sella may be present.  No hydrocephalus.  Vascular calcifications.  IMPRESSION: Asymmetric white matter type changes greater on the left frontal lobe.  This may be related to the small vessel disease type changes although acute infarct is not excluded given the right arm weakness.  MR imaging may prove helpful for further delineation.  Critical Value/emergent results were called by telephone at the time of interpretation on 10/07/2011  at 11/08.  to  Dr. Herbie Baltimore, who verbally acknowledged these results.  Original Report Authenticated By: Fuller Canada, M.D.    Assessment: 51 y.o. male with symptoms of right sided weakness.  Exam functional  currently and does not suggest an acute focal lesion.  Patient improving.  Patient on ASA.    Stroke Risk Factors - hyperlipidemia, hypertension and CAD  Plan: 1. HgbA1c, fasting lipid panel 2. MRI, MRA  of the brain without contrast 3. PT consult, OT consult, Speech consult 4. Prophylactic therapy-continue ASA   Thana Farr, MD [facility] [phone] 10/15/2011, 12:51 PM

## 2011-10-15 NOTE — Progress Notes (Signed)
[facility]  Subjective: Still with intermittent chest pains No longer having throat soreness.  Objective: Vital signs in last 24 hours: Temp:  [97.6 F (36.4 C)-98.6 F (37 C)] 97.6 F (36.4 C) (01/28 0500) Pulse Rate:  [65-79] 65  (01/28 0500) Resp:  [18] 18  (01/28 0500) BP: (116-137)/(70-84) 133/84 mmHg (01/28 0500) SpO2:  [98 %] 98 % (01/28 0500) Weight:  [102.105 kg (225 lb 1.6 oz)] 102.105 kg (225 lb 1.6 oz) (01/28 0500) Last BM Date: 10/12/11  Intake/Output from previous day: 01/27 0701 - 01/28 0700 In: 270.6 [I.V.:270.6] Out: 260 [Urine:260] Intake/Output this shift:    Medications Current Facility-Administered Medications  Medication Dose Route Frequency Provider Last Rate Last Dose  . 0.9 %  sodium chloride infusion  250 mL Intravenous PRN Baltazar Najjar, MD      . 0.9 %  sodium chloride infusion  250 mL Intravenous PRN Marykay Lex, MD      . 0.9 %  sodium chloride infusion  1 mL/kg/hr Intravenous Continuous Marykay Lex, MD 99 mL/hr at 10/15/11 0403 1 mL/kg/hr at 10/15/11 0403  . acetaminophen (TYLENOL) tablet 650 mg  650 mg Oral Q6H PRN Sorin Lavera Guise, MD      . albuterol (PROVENTIL) (5 MG/ML) 0.5% nebulizer solution 2.5 mg  2.5 mg Nebulization Q4H PRN Baltazar Najjar, MD      . amLODipine (NORVASC) tablet 10 mg  10 mg Oral Daily Baltazar Najjar, MD   10 mg at 10/14/11 1037  . aspirin chewable tablet 324 mg  324 mg Oral Pre-Cath Marykay Lex, MD   324 mg at 10/15/11 0641  . diazepam (VALIUM) tablet 5 mg  5 mg Oral On Call Marykay Lex, MD      . enoxaparin (LOVENOX) injection 40 mg  40 mg Subcutaneous Q24H Baltazar Najjar, MD   40 mg at 10/14/11 1846  . hydrocortisone sodium succinate (SOLU-CORTEF) injection 25 mg  25 mg Intravenous Q12H Sorin Lavera Guise, MD   25 mg at 10/14/11 2221  . ipratropium (ATROVENT) nebulizer solution 0.5 mg  0.5 mg Nebulization Q6H PRN Baltazar Najjar, MD      . isosorbide mononitrate (IMDUR) 24 hr tablet  30 mg  30 mg Oral Daily Marykay Lex, MD   30 mg at 10/14/11 1039  . levofloxacin (LEVAQUIN) tablet 500 mg  500 mg Oral Daily Baltazar Najjar, MD   500 mg at 10/14/11 1039  . LORazepam (ATIVAN) injection 0.5 mg  0.5 mg Intravenous Q6H PRN Baltazar Najjar, MD   0.5 mg at 10/15/11 0552  . losartan (COZAAR) tablet 100 mg  100 mg Oral Daily Baltazar Najjar, MD   100 mg at 10/14/11 1040  . menthol-cetylpyridinium (CEPACOL) lozenge 3 mg  1 lozenge Oral PRN Sorin Lavera Guise, MD      . morphine 2 MG/ML injection 2 mg  2 mg Intravenous Q2H PRN Sorin Laza, MD   2 mg at 10/14/11 1635  . nicotine (NICODERM CQ - dosed in mg/24 hours) patch 14 mg  14 mg Transdermal Daily Baltazar Najjar, MD   14 mg at 10/14/11 1041  . nitroGLYCERIN (NITROSTAT) SL tablet 0.4 mg  0.4 mg Sublingual Q5 min PRN Baltazar Najjar, MD   0.4 mg at 10/13/11 1012  . ondansetron (ZOFRAN) injection 4 mg  4 mg Intravenous Once [employer]      . ondansetron (ZOFRAN) injection 4 mg  4 mg Intravenous Q6H PRN Baltazar Najjar, MD   4  mg at 10/14/11 1628  . sodium chloride 0.9 % injection 3 mL  3 mL Intravenous Q12H Baltazar Najjar, MD   3 mL at 10/14/11 1041  . sodium chloride 0.9 % injection 3 mL  3 mL Intravenous PRN Baltazar Najjar, MD      . sodium chloride 0.9 % injection 3 mL  3 mL Intravenous Q12H Marykay Lex, MD      . sodium chloride 0.9 % injection 3 mL  3 mL Intravenous PRN Marykay Lex, MD      . zolpidem Remus Loffler) tablet 5 mg  5 mg Oral QHS PRN Marykay Lex, MD   5 mg at 10/14/11 2242  . DISCONTD: HYDROcodone-acetaminophen (NORCO) 5-325 MG per tablet 1 tablet  1 tablet Oral Q6H PRN Baltazar Najjar, MD   1 tablet at 10/13/11 2125  . DISCONTD: predniSONE (STERAPRED UNI-PAK) tablet 10-60 mg  10-60 mg Oral Daily Baltazar Najjar, MD   30 mg at 10/13/11 1000  . DISCONTD: rosuvastatin (CRESTOR) tablet 5 mg  5 mg Oral Daily Baltazar Najjar, MD   5 mg at 10/13/11 1001    PE: BP 133/84  Pulse 65  Temp(Src) 97.6 F (36.4 C)  (Oral)  Resp 18  Ht 5\' 11"  (1.803 m)  Wt 102.105 kg (225 lb 1.6 oz)  BMI 31.40 kg/m2  SpO2 98% General appearance: alert, cooperative, no distress, mildly obese and very anxious Neck: no adenopathy, no carotid bruit, no JVD, supple, symmetrical, trachea midline, thyroid not enlarged, symmetric, no tenderness/mass/nodules and thick Lungs: clear to auscultation bilaterally, normal percussion bilaterally and non-labored Heart: regular rate and rhythm, S1, S2 normal, no murmur, click, rub or gallop Abdomen: soft, non-tender; bowel sounds normal; no masses,  no organomegaly Extremities: extremities normal, atraumatic, no cyanosis or edema Pulses: 2+ and symmetric Neurologic: Grossly normal  Lab Results:   Basename 10/15/11 0535 10/13/11 0630 10/12/11 1253  WBC 8.9 6.1 10.3  HGB 12.4* 12.7* 14.0  HCT 38.7* 38.3* 41.0  PLT 276 280 300   BMET  Basename 10/13/11 0630 10/12/11 1253  NA 137 139  K 4.9 3.6  CL 104 103  CO2 27 26  GLUCOSE 129* 107*  BUN 10 10  CREATININE 0.86 0.84  CALCIUM 8.3* 8.9   Cardiac Enzymes remain negative  Assessment/Plan  Principal Problem:  *Chest pain Active Problems:  COPD with acute exacerbation  Hypertension  Tobacco abuse  Hyperlipidemia  Hx of thoracic aortic aneurysm repair    LOS: 3 days    Bradley Prince W 10/15/2011 8:45 AM  Key new complaints: throat feels better, still having some of the chest discomfort intermittently.  Very nervous Key examination changes: normal exam Key new findings / data: CE negative.  Normal labs  PLAN: Based on the CT with calcified RCA & atypical symptoms for angina with significant Cardiac RFs, I feel that the most expeditious way to determine if there is a cardiac cause for his chest discomfort is to pursue invasive cardiac evaluation as opposed to NST.  With the chronicity of his symptoms, he may well return with similar symptoms despite a potentially "negative Cardiolite/Myoview".    We discussed  this predicament & he agrees to proceed provided that I am the one performing the procedure.  We will plan for LHC today.   CARDIAC CATHETERIZATION CONSENT   Performing MD:  Marykay Lex, M.D., M.S.  Procedure:  Left Heart Catheterization with Coronary Angiography, and Possible ad-hoc Percutaneous Coronary Angiography  The procedure with Risks/Benefits/Alternatives and Indications was  reviewed with the patient.  All questions were answered.    Risks / Complications include, but not limited to: Death, MI, CVA/TIA, VF/VT (with defibrillation), Bradycardia (need for temporary pacer placement), contrast induced nephropathy, bleeding / bruising / hematoma / pseudoaneurysm, vascular or coronary injury (with possible emergent CT or Vascular Surgery), adverse medication reactions, infection.    The patient voices understanding and agree to proceed.      Marykay Lex, M.D., M.S. [facility] [address] [place], [place]  [address]  [phone]  10/15/2011 8:45 AM

## 2011-10-15 NOTE — Progress Notes (Signed)
Speech Language/Pathology Clinical/Bedside Swallow Evaluation Patient Details  Name: Bradley Prince MRN: [id number] DOB: [birth year] Today's Date: 10/15/2011  Past Medical History:  Past Medical History  Diagnosis Date  . Asthma   . Hypertension   . Hyperlipidemia   . Anxiety   . Tobacco abuse   . Peripheral arterial occlusive disease 2012    s/p PTCA/stent  . Coronary artery disease   . Mental disorder     histronic personality disorder   Past Surgical History:  Past Surgical History  Procedure Date  . Appendectomy   . Cholecystectomy   . Abdominal aortic aneurysm repair   . Renal artery stents    HPI:  Pt is a 51 year old male admitted with chest pain, underwent cardiac cath, experienced onset of right sided weakness. CT inconclusive regarding infarct. Pt did report some feeling of something stuck in throat 1 day prior to procedure. Otherwise pt denies any history of difficulty swallowing   Assessment/Recommendations/Treatment Plan Suspected Esophageal Findings Suspected Esophageal Findings:  (grimacing)  SLP Assessment Clinical Impression Statement: Pt presents with overt s/s of aspiration with thin liquids with questionable etiology. Given suspicion of neuro deficits concern for overt pharyngeal dysphagia significant. However, also some question of esophageal dysphagia given report of chest pain though with uncharacterstic acute onset. Feel pt would benefit from rest given high level of anxiety. Would recommend NPO except meds for tonight, with f/u in am for further POs trials vs need for objective testing if signs of aspiration persist.  Risk for Aspiration: Mild  Swallow Evaluation Recommendations General Recommendation: NPO except meds Medication Administration: Whole meds with puree  Treatment Plan Treatment Plan Recommendations: Therapy as outlined in treatment plan below Speech Therapy Frequency: min 2x/week Treatment Duration: 2 weeks Interventions:  Compensatory techniques;Trials of upgraded texture/liquids;Diet toleration management by SLP;Patient/family education  Prognosis Prognosis for Safe Diet Advancement: Good  Individuals Consulted Consulted and Agree with Results and Recommendations: Patient;RN  Swallowing Goals  SLP Swallowing Goals Goal #3: Pt will consume PO trials to determine safety with diet initation vs need for objective testing.  Swallow Study Goal #3 - Progress: Progressing toward goal [facility], [place] CCC-SLP [phone]  Ellias Mcelreath, Riley Nearing 10/15/2011,4:59 PM

## 2011-10-15 NOTE — Progress Notes (Signed)
Subjective:  Very sleepy after cath c/o weakness on the right side. Able to speak well. Not confused at all. C/o headache,    Physical Exam: Blood pressure 129/70, pulse 86, temperature 97.5 F (36.4 C), temperature source Oral, resp. rate 18, height 5\' 11"  (1.803 m), weight 102.105 kg (225 lb 1.6 oz), SpO2 100.00%.  Temp:  [97.5 F (36.4 C)-98.6 F (37 C)] 97.5 F (36.4 C) (01/28 1600) Pulse Rate:  [65-86] 86  (01/28 1600) Resp:  [16-18] 18  (01/28 1600) BP: (116-133)/(67-84) 129/70 mmHg (01/28 1600) SpO2:  [98 %-100 %] 100 % (01/28 1600) Weight:  [102.105 kg (225 lb 1.6 oz)] 102.105 kg (225 lb 1.6 oz) (01/28 0500)  arousable Oriented x3 CVS: rrr Rs: ctab  Abdomen: soft, nt Good reflex strength in the right hand    Investigations:  No results found for this or any previous visit (from the past 240 hour(s)).   Basic Metabolic Panel:  Basename 10/13/11 0630  NA 137  K 4.9  CL 104  CO2 27  GLUCOSE 129*  BUN 10  CREATININE 0.86  CALCIUM 8.3*  MG --  PHOS --   Liver Function Tests: No results found for this basename: AST:2,ALT:2,ALKPHOS:2,BILITOT:2,PROT:2,ALBUMIN:2 in the last 72 hours   CBC:  Basename 10/15/11 0535 10/13/11 0630  WBC 8.9 6.1  NEUTROABS -- --  HGB 12.4* 12.7*  HCT 38.7* 38.3*  MCV 89.8 89.3  PLT 276 280   EKG: NSR, no ST changes    Ct Head Wo Contrast  10/15/2011  *RADIOLOGY REPORT*  Clinical Data: From the cath lab.  Right arm weakness.  CT HEAD WITHOUT CONTRAST  Technique:  Contiguous axial images were obtained from the base of the skull through the vertex without contrast.  Comparison: None.  Findings: Contrast from recent catheterization without obvious intracranial hemorrhage.  No intracranial enhancing lesion.  Asymmetric white matter type changes greater on the left frontal lobe.  This may be related to the small vessel disease type changes although acute infarct is not excluded given the right arm weakness.  MR imaging may prove  helpful for further delineation.  Cerebellar tonsils minimally low-lying although within normal limits.  Partially empty sella may be present.  No hydrocephalus.  Vascular calcifications.  IMPRESSION: Asymmetric white matter type changes greater on the left frontal lobe.  This may be related to the small vessel disease type changes although acute infarct is not excluded given the right arm weakness.  MR imaging may prove helpful for further delineation.  Critical Value/emergent results were called by telephone at the time of interpretation on 10/07/2011  at 11/08.  to  Dr. Herbie Baltimore, who verbally acknowledged these results.  Original Report Authenticated By: Fuller Canada, M.D.   Results for Bradley Prince, Bradley Prince (MRN [id number]) as of 10/14/2011 08:12  Ref. Range 10/12/2011 15:23 10/12/2011 15:24 10/12/2011 19:04 10/13/2011 06:30 10/13/2011 16:56  CK, MB Latest Range: 0.3-4.0 ng/mL   1.7  1.6  CK Total Latest Range: 7-232 U/L   62  78  Troponin I Latest Range: <0.30 ng/mL <0.30  <0.30  <0.30     Medications:  Scheduled:    . amLODipine  10 mg Oral Daily  . aspirin  324 mg Oral Pre-Cath  . diazepam  5 mg Oral On Call  . fentaNYL      . flumazenil  0.5 mg Intravenous Once  . flumazenil  0.5 mg Intravenous Once  . heparin      . hydrALAZINE  5 mg Intravenous  Once  . lidocaine      . losartan  100 mg Oral Daily  . midazolam      . naloxone  0.4 mg Intravenous Once  . nicotine  14 mg Transdermal Daily  . nitroGLYCERIN      . sodium chloride  3 mL Intravenous Q12H  . DISCONTD: enoxaparin  40 mg Subcutaneous Q24H  . DISCONTD: hydrocortisone sod succinate (SOLU-CORTEF) injection  25 mg Intravenous Q12H  . DISCONTD: isosorbide mononitrate  30 mg Oral Daily  . DISCONTD: levofloxacin  500 mg Oral Daily  . DISCONTD: ondansetron (ZOFRAN) IV  4 mg Intravenous Once  . DISCONTD: sodium chloride  3 mL Intravenous Q12H  . DISCONTD: sodium chloride  3 mL Intravenous Q12H   Continuous:    . sodium  chloride 1 mL/kg/hr (10/15/11 1231)  . DISCONTD: sodium chloride 1 mL/kg/hr (10/15/11 0403)   ZOX:WRUEAV chloride, acetaminophen, albuterol, hydrALAZINE, ipratropium, menthol-cetylpyridinium, ondansetron, sodium chloride, DISCONTD: sodium chloride, DISCONTD: sodium chloride, DISCONTD: LORazepam, DISCONTD:  morphine injection, DISCONTD: nitroGLYCERIN, DISCONTD: sodium chloride, DISCONTD: sodium chloride, DISCONTD: zolpidem  Performing Cardiologist: Bradley Lex, MD, MS  Primary Physician: Duncan Dull, MD, MD  Primary Cardiologist: Bradley Lex, MD, MS  Procedures Performed:  Left Heart Catheterization via 5 Fr Right Common Femoral Artery access  Left Ventriculography, (RAO) 11 ml/sec for 33 ml total contrast  Native Coronary Angiography  Indication(s): Recurrent Left Sided Chest Pain at rest  HTN, HLD & Smoker  S/p TAA repair  S/p RAS stenting History: 51 y.o. male a past medical history significant for thoracic aortic aneurysm status post dacryon graft replacement 1996, renal artery stenosis status post stenting in 2012 as well as hypertension hyperlipidemia and chronic smoking who was in the emergency room at the recommendation of his primary physician for intermittent chest pain lasting since Monday (1/21). To the admitting physician he noted the pain was sharp pain in his left chest radiating to his left shoulder. To me he describes it as a heaviness in his chest. Monday he felt as though he was developing a cold and felt that portable heaviness in his chest. Then it would come and go and last up to 20 minutes this I was not so she was short of breath. Today he felt better with having less frequently but Wednesday came back more significantly associated with shortness of breath and nausea. He denies any coughing or wheezing, but when he went to go see his primary physician, she noted wheezing and started him on antibiotics and steroids which had little help. By the time he arrived at her  office his symptoms abated. He took today off on Thursday but continued to have intermittent symptoms they said would last up to 20 minutes At a time would come, and never truly go away. He awoke Friday morning around 3:00 with this pain and shortness of breath and it radiated to his left shoulder. After this away he went back to sleep however when he got up to go to his car to drive to work, he had recurrence of symptoms which prompted her to call her physician. She recommended they called the 911, and he was brought to the emergency room. He is being admitted to [facility].  After consultation, the decision was made to proceed with diagnostic cardiac catheterization based upon his RFs & CTA of chest with ? RCA disease.  Consent: The procedure with Risks/Benefits/Alternatives and Indications was reviewed with the patient. All questions were answered.  Risks / Complications include, but  not limited to: Death, MI, CVA/TIA, VF/VT (with defibrillation), Bradycardia (need for temporary pacer placement), contrast induced nephropathy, bleeding / bruising / hematoma / pseudoaneurysm, vascular or coronary injury (with possible emergent CT or Vascular Surgery), adverse medication reactions, infection.  The patient voiced understanding and agree to proceed.  Consent for signed by MD and patient with RN witness -- placed on chart.  Procedure: The patient was brought to the 2nd Floor [facility] in the fasting state and prepped and draped in the usual sterile fashion for Right groin access. Sterile technique was used including antiseptics, cap, gloves, gown, hand hygiene, mask and sheet.  Skin prep: Chlorhexidine;  Time Out: Verified patient identification, verified procedure, site/side was marked, verified correct patient position, special equipment/implants available, medications/allergies/relevent history reviewed, required imaging and test results available. Performed  The  right femoral head was identified using tactile and fluoroscopic technique. The right groin was anesthetized with 1% subcutaneous Lidocaine. The right Common Femoral Artery was accessed using the Modified Seldinger Technique with placement of a antimicrobial bonded/coated single lumen (5 Fr) sheath. The sheath was aspirated and flushed.  A 5 Fr JL4 Catheter was advanced of over a Standard J wire into the ascending Aorta. The catheter was used to engage the Left Coronary Artery. Multiple cineangiographic views of the Left Coronary Artery system(s) were performed.  A 5 Fr JR4 Catheter was advanced of over a Standard J wire into the ascending Aorta. The catheter was used to engage the Right Coronary Artery. Multiple cineangiographic views of the Right Coronary Artery system were performed.  This catheter was then exchanged over the standard J wire for an angled Pigtail catheter that was advanced across the Aortic Valve. LV hemodynamics were measured,and and andLeft Ventriculography was performed. LV hemodynamics were then re-sampled, and the catheter was pulled back across the Aortic Valve for measurement of "pull-back" gradient. The catheter and the wire were removed completely out of the body.  The patient was transferred to the holding area where the sheath was removed with manual pressure held for hemostasis.  I the holding area,the patient became less responsive - with concern for R sided weakness. 1 round of Narcan & 2 doses of Rumazecon were given. Patient is now more arousable & moving all extremities. Have as asked for Neurology input.  The patient was transported to the PACU in moderately sedated, but hemodynamically stable condition.  The patient was stable before, during and following the procedure - until arrival to PACU.  Patient did tolerate procedure well.  There were not complications during the procedure & the procedure went smoothly.  EBL: <55ml  Medications:  Premedication: 5mg  Valium  (apparently given IV Ativan @ ~0600)  Sedation: 2 mg IV Versed, 50 IV mcg Fentanyl  Contrast: 75ml Omnipaque  Hemodynamics:  Central Aortic Pressure / Mean Aortic Pressure: 126/75 mmHg, 98 mmhg  LV Pressure / LV End diastolic Pressure: 120/11 mmHg,  Left Ventriculography:  EF: 60-65%  Wall Motion: Normal  Coronary Angiographic Data: Right Dominant  Left Main: Large caliber, angiographically normal; bifurcates into LAD, LCx  Left Anterior Descending (LAD): Moderate-to-large caliber proximal vessel with very proximal small D1; both the ostial/proximal LAD & Diag have ~40% stenosis. The LAD then essentially bifurcates into equal sized D2 & distal LAD. D2 has several branches with no significant disease. The Distal LAD tapers to the apex & gives rise to a small D2 from the distal mid vessel.  Circumflex (LCx): Large caliber, non-dominant vessel, gives rise  to a very proximal small OM1 with ~50% ostial stenosis as well as a proximal atrial branch before branching into a bifurcating Moderate caliber OM (superior branch larger & bifurcating) and small caliber AV Groove artery. No significant disease in the remainder of the circumflex system Right Coronary Artery: Large dominant vessel roughly 6 mm proximal has a moderate caliber marginal branch before bifurcating the relatively high bifurcation gives rise to a moderate to large caliber posterior descending artery and posterior atrioventricular groove vessel. No significant CAD this vessel. The ostium has a somewhat anterior takeoff. RPDA: Moderate to large caliber vessel, bifurcates the distal portion; minimal luminal irregularities  Right Posterior Atrioventricular Groove Branch: large caliber vessel that gives rise to a small posterior lateral branch followed by bifurcating into 2 larger posterior lateral branches an extensive distribution. No significant disease. Impression:  1. Essentially normal coronary arteries these are going moderate  disease in the ostial LAD ostial D1 and ostial OM1. No lesions to explain resting chest pain.  2. Well-preserved, vigorous Left Ventricular Ejection Fraction with no regional wall motion abnormalities  3. Most likely etiology of chest pain is noncardiac.  Plan:  I have asked the neurology team to evaluate for his altered mental status however my associates of this is due to sedation medications as he received significant doses of benzodiazepines and narcotics as responded to reversal agents.  Transfer back to his bed following sheath removal.  Continue risk factor modification with blood pressure and lipid control.  Anticipate discharge based on hospitalist service assessment of noncardiac chest pain. The case and results was discussed with the patient (and family).  The case and results was discussed with the patient's Cardiologist   Impression/Plan: 51 yo man with hx of TAA repair presnted with chest pain. CE wnl x3. Chest pain remained persistent and CTA chest was negative for aortic dissection or PE. On CT coronary calcifications were seen and the patient was kept over the weekend for diagnostic cardiac cath on 10/15/11.  On 10/14/11 the patient c/o throat feeling swollen - ? angioedema- unclear which might be the culprit medication. He took crestor and vicodin before going to bed. We discontinued those medications.   The patient had cardiac cath on 10/15/11 with minimal disease being found. The patient developed right side weakness post cath and is now observed closely in the neuro unit.        LOS: 3 days   Dyneisha Murchison, MD Pager: [phone] 10/15/2011, 4:58 PM

## 2011-10-15 NOTE — Consult Note (Signed)
Pt smokes 1/4 ppd and says he has quit several times before. Started the consult but pt keeps falling asleep. Will let pt sleep for now and will revisit later.

## 2011-10-16 ENCOUNTER — Inpatient Hospital Stay (HOSPITAL_COMMUNITY): Payer: BC Managed Care – PPO

## 2011-10-16 DIAGNOSIS — R531 Weakness: Secondary | ICD-10-CM | POA: Diagnosis not present

## 2011-10-16 LAB — HEMOGLOBIN A1C
Hgb A1c MFr Bld: 6 % — ABNORMAL HIGH (ref <5.7)
Mean Plasma Glucose: 126 mg/dL — ABNORMAL HIGH (ref <117)

## 2011-10-16 MED ORDER — LORAZEPAM 1 MG PO TABS
1.0000 mg | ORAL_TABLET | Freq: Once | ORAL | Status: AC
  Administered 2011-10-16: 1 mg via ORAL
  Filled 2011-10-16: qty 1

## 2011-10-16 MED ORDER — HYDROCODONE-ACETAMINOPHEN 5-325 MG PO TABS: 1.0000 | ORAL_TABLET | Freq: Four times a day (QID) | ORAL | Status: DC | PRN

## 2011-10-16 MED ORDER — NICOTINE 14 MG/24HR TD PT24: 1.0000 | MEDICATED_PATCH | Freq: Every day | TRANSDERMAL | Status: AC

## 2011-10-16 MED ORDER — ROSUVASTATIN CALCIUM 20 MG PO TABS
20.0000 mg | ORAL_TABLET | Freq: Every day | ORAL | Status: DC
  Filled 2011-10-16: qty 1

## 2011-10-16 MED ORDER — ASPIRIN 300 MG RE SUPP
300.0000 mg | Freq: Every day | RECTAL | Status: DC
  Filled 2011-10-16: qty 1

## 2011-10-16 MED ORDER — LORAZEPAM 2 MG/ML IJ SOLN
1.0000 mg | Freq: Once | INTRAMUSCULAR | Status: AC
  Administered 2011-10-16: 1 mg via INTRAVENOUS
  Filled 2011-10-16: qty 1

## 2011-10-16 NOTE — Progress Notes (Signed)
Subjective: No new events  Physical Exam: Blood pressure 123/79, pulse 67, temperature 97.7 F (36.5 C), temperature source Oral, resp. rate 16, height 5\' 11"  (1.803 m), weight 102.105 kg (225 lb 1.6 oz), SpO2 94.00%.  Temp:  [97.3 F (36.3 C)-98.9 F (37.2 C)] 97.7 F (36.5 C) (01/29 0600) Pulse Rate:  [55-86] 67  (01/29 0600) Resp:  [16-18] 16  (01/29 0600) BP: (94-140)/(65-83) 123/79 mmHg (01/29 0600) SpO2:  [94 %-100 %] 94 % (01/29 0600)  Alert and oriented x3 Cvs: rrr Rs: ctab  Abdomen: soft, nt  Investigations:  No results found for this or any previous visit (from the past 240 hour(s)).   Basic Metabolic Panel: No results found for this basename: NA:2,K:2,CL:2,CO2:2,GLUCOSE:2,BUN:2,CREATININE:2,CALCIUM:2,MG:2,PHOS:2 in the last 72 hours Liver Function Tests: No results found for this basename: AST:2,ALT:2,ALKPHOS:2,BILITOT:2,PROT:2,ALBUMIN:2 in the last 72 hours   CBC:  Basename 10/15/11 0535  WBC 8.9  NEUTROABS --  HGB 12.4*  HCT 38.7*  MCV 89.8  PLT 276   EKG: NSR, no ST changes    Ct Head Wo Contrast  10/15/2011  *RADIOLOGY REPORT*  Clinical Data: From the cath lab.  Right arm weakness.  CT HEAD WITHOUT CONTRAST  Technique:  Contiguous axial images were obtained from the base of the skull through the vertex without contrast.  Comparison: None.  Findings: Contrast from recent catheterization without obvious intracranial hemorrhage.  No intracranial enhancing lesion.  Asymmetric white matter type changes greater on the left frontal lobe.  This may be related to the small vessel disease type changes although acute infarct is not excluded given the right arm weakness.  MR imaging may prove helpful for further delineation.  Cerebellar tonsils minimally low-lying although within normal limits.  Partially empty sella may be present.  No hydrocephalus.  Vascular calcifications.  IMPRESSION: Asymmetric white matter type changes greater on the left frontal lobe.  This may  be related to the small vessel disease type changes although acute infarct is not excluded given the right arm weakness.  MR imaging may prove helpful for further delineation.  Critical Value/emergent results were called by telephone at the time of interpretation on 10/07/2011  at 11/08.  to  Dr. Herbie Baltimore, who verbally acknowledged these results.  Original Report Authenticated By: Fuller Canada, M.D.   Results for MARCAS, BOWSHER (MRN [id number]) as of 10/14/2011 08:12  Ref. Range 10/12/2011 15:23 10/12/2011 15:24 10/12/2011 19:04 10/13/2011 06:30 10/13/2011 16:56  CK, MB Latest Range: 0.3-4.0 ng/mL   1.7  1.6  CK Total Latest Range: 7-232 U/L   62  78  Troponin I Latest Range: <0.30 ng/mL <0.30  <0.30  <0.30     Medications:  Scheduled:    . amLODipine  10 mg Oral Daily  . diazepam  5 mg Oral On Call  . fentaNYL      . flumazenil  0.5 mg Intravenous Once  . flumazenil  0.5 mg Intravenous Once  . heparin      . hydrALAZINE  5 mg Intravenous Once  . lidocaine      . losartan  100 mg Oral Daily  . midazolam      . naloxone  0.4 mg Intravenous Once  . nicotine  14 mg Transdermal Daily  . nitroGLYCERIN      . sodium chloride  3 mL Intravenous Q12H  . DISCONTD: enoxaparin  40 mg Subcutaneous Q24H  . DISCONTD: hydrocortisone sod succinate (SOLU-CORTEF) injection  25 mg Intravenous Q12H  . DISCONTD: isosorbide mononitrate  30  mg Oral Daily  . DISCONTD: levofloxacin  500 mg Oral Daily  . DISCONTD: ondansetron (ZOFRAN) IV  4 mg Intravenous Once  . DISCONTD: sodium chloride  3 mL Intravenous Q12H  . DISCONTD: sodium chloride  3 mL Intravenous Q12H   Continuous:    . sodium chloride 1 mL/kg/hr (10/15/11 1231)  . DISCONTD: sodium chloride 1 mL/kg/hr (10/15/11 0403)   WGN:FAOZHY chloride, acetaminophen, acetaminophen, albuterol, hydrALAZINE, ipratropium, menthol-cetylpyridinium, morphine injection, ondansetron, sodium chloride, DISCONTD: sodium chloride, DISCONTD: sodium chloride,  DISCONTD: LORazepam, DISCONTD:  morphine injection, DISCONTD: nitroGLYCERIN, DISCONTD: sodium chloride, DISCONTD: sodium chloride, DISCONTD: zolpidem  Performing Cardiologist: Marykay Lex, MD, MS  Primary Physician: Duncan Dull, MD, MD  Primary Cardiologist: Marykay Lex, MD, MS  Procedures Performed:  Left Heart Catheterization via 5 Fr Right Common Femoral Artery access  Left Ventriculography, (RAO) 11 ml/sec for 33 ml total contrast  Native Coronary Angiography  Indication(s): Recurrent Left Sided Chest Pain at rest  HTN, HLD & Smoker  S/p TAA repair  S/p RAS stenting History: 51 y.o. male a past medical history significant for thoracic aortic aneurysm status post dacryon graft replacement 1996, renal artery stenosis status post stenting in 2012 as well as hypertension hyperlipidemia and chronic smoking who was in the emergency room at the recommendation of his primary physician for intermittent chest pain lasting since Monday (1/21). To the admitting physician he noted the pain was sharp pain in his left chest radiating to his left shoulder. To me he describes it as a heaviness in his chest. Monday he felt as though he was developing a cold and felt that portable heaviness in his chest. Then it would come and go and last up to 20 minutes this I was not so she was short of breath. Today he felt better with having less frequently but Wednesday came back more significantly associated with shortness of breath and nausea. He denies any coughing or wheezing, but when he went to go see his primary physician, she noted wheezing and started him on antibiotics and steroids which had little help. By the time he arrived at her office his symptoms abated. He took today off on Thursday but continued to have intermittent symptoms they said would last up to 20 minutes At a time would come, and never truly go away. He awoke Friday morning around 3:00 with this pain and shortness of breath and it radiated to  his left shoulder. After this away he went back to sleep however when he got up to go to his car to drive to work, he had recurrence of symptoms which prompted her to call her physician. She recommended they called the 911, and he was brought to the emergency room. He is being admitted to [facility].  After consultation, the decision was made to proceed with diagnostic cardiac catheterization based upon his RFs & CTA of chest with ? RCA disease.  Consent: The procedure with Risks/Benefits/Alternatives and Indications was reviewed with the patient. All questions were answered.  Risks / Complications include, but not limited to: Death, MI, CVA/TIA, VF/VT (with defibrillation), Bradycardia (need for temporary pacer placement), contrast induced nephropathy, bleeding / bruising / hematoma / pseudoaneurysm, vascular or coronary injury (with possible emergent CT or Vascular Surgery), adverse medication reactions, infection.  The patient voiced understanding and agree to proceed.  Consent for signed by MD and patient with RN witness -- placed on chart.  Procedure: The patient was brought to the 2nd Floor [facility] in the  fasting state and prepped and draped in the usual sterile fashion for Right groin access. Sterile technique was used including antiseptics, cap, gloves, gown, hand hygiene, mask and sheet.  Skin prep: Chlorhexidine;  Time Out: Verified patient identification, verified procedure, site/side was marked, verified correct patient position, special equipment/implants available, medications/allergies/relevent history reviewed, required imaging and test results available. Performed  The right femoral head was identified using tactile and fluoroscopic technique. The right groin was anesthetized with 1% subcutaneous Lidocaine. The right Common Femoral Artery was accessed using the Modified Seldinger Technique with placement of a antimicrobial bonded/coated single  lumen (5 Fr) sheath. The sheath was aspirated and flushed.  A 5 Fr JL4 Catheter was advanced of over a Standard J wire into the ascending Aorta. The catheter was used to engage the Left Coronary Artery. Multiple cineangiographic views of the Left Coronary Artery system(s) were performed.  A 5 Fr JR4 Catheter was advanced of over a Standard J wire into the ascending Aorta. The catheter was used to engage the Right Coronary Artery. Multiple cineangiographic views of the Right Coronary Artery system were performed.  This catheter was then exchanged over the standard J wire for an angled Pigtail catheter that was advanced across the Aortic Valve. LV hemodynamics were measured,and and andLeft Ventriculography was performed. LV hemodynamics were then re-sampled, and the catheter was pulled back across the Aortic Valve for measurement of "pull-back" gradient. The catheter and the wire were removed completely out of the body.  The patient was transferred to the holding area where the sheath was removed with manual pressure held for hemostasis.  I the holding area,the patient became less responsive - with concern for R sided weakness. 1 round of Narcan & 2 doses of Rumazecon were given. Patient is now more arousable & moving all extremities. Have as asked for Neurology input.  The patient was transported to the PACU in moderately sedated, but hemodynamically stable condition.  The patient was stable before, during and following the procedure - until arrival to PACU.  Patient did tolerate procedure well.  There were not complications during the procedure & the procedure went smoothly.  EBL: <47ml  Medications:  Premedication: 5mg  Valium (apparently given IV Ativan @ ~0600)  Sedation: 2 mg IV Versed, 50 IV mcg Fentanyl  Contrast: 75ml Omnipaque  Hemodynamics:  Central Aortic Pressure / Mean Aortic Pressure: 126/75 mmHg, 98 mmhg  LV Pressure / LV End diastolic Pressure: 120/11 mmHg,  Left  Ventriculography:  EF: 60-65%  Wall Motion: Normal  Coronary Angiographic Data: Right Dominant  Left Main: Large caliber, angiographically normal; bifurcates into LAD, LCx  Left Anterior Descending (LAD): Moderate-to-large caliber proximal vessel with very proximal small D1; both the ostial/proximal LAD & Diag have ~40% stenosis. The LAD then essentially bifurcates into equal sized D2 & distal LAD. D2 has several branches with no significant disease. The Distal LAD tapers to the apex & gives rise to a small D2 from the distal mid vessel.  Circumflex (LCx): Large caliber, non-dominant vessel, gives rise to a very proximal small OM1 with ~50% ostial stenosis as well as a proximal atrial branch before branching into a bifurcating Moderate caliber OM (superior branch larger & bifurcating) and small caliber AV Groove artery. No significant disease in the remainder of the circumflex system Right Coronary Artery: Large dominant vessel roughly 6 mm proximal has a moderate caliber marginal branch before bifurcating the relatively high bifurcation gives rise to a moderate to large caliber posterior descending artery and posterior  atrioventricular groove vessel. No significant CAD this vessel. The ostium has a somewhat anterior takeoff. RPDA: Moderate to large caliber vessel, bifurcates the distal portion; minimal luminal irregularities  Right Posterior Atrioventricular Groove Branch: large caliber vessel that gives rise to a small posterior lateral branch followed by bifurcating into 2 larger posterior lateral branches an extensive distribution. No significant disease. Impression:  1. Essentially normal coronary arteries these are going moderate disease in the ostial LAD ostial D1 and ostial OM1. No lesions to explain resting chest pain.  2. Well-preserved, vigorous Left Ventricular Ejection Fraction with no regional wall motion abnormalities  3. Most likely etiology of chest pain is noncardiac.  Plan:  I have  asked the neurology team to evaluate for his altered mental status however my associates of this is due to sedation medications as he received significant doses of benzodiazepines and narcotics as responded to reversal agents.  Transfer back to his bed following sheath removal.  Continue risk factor modification with blood pressure and lipid control.  Anticipate discharge based on hospitalist service assessment of noncardiac chest pain. The case and results was discussed with the patient (and family).  The case and results was discussed with the patient's Cardiologist   Impression/Plan: 51 yo man with hx of TAA repair presnted with chest pain. CE wnl x3. Chest pain remained persistent and CTA chest was negative for aortic dissection or PE. On CT coronary calcifications were seen and the patient was kept over the weekend for diagnostic cardiac cath on 10/15/11.  On 10/14/11 the patient c/o throat feeling swollen - ? angioedema- unclear which might be the culprit medication. He took crestor and vicodin before going to bed. We discontinued those medications.   The patient had cardiac cath on 10/15/11 with minimal disease being found. The patient developed right side weakness post cath and is now observed closely in the neuro unit.    D/c home today as mri shows no acute cva     LOS: 4 days   Bryttany Tortorelli, MD Pager: [phone] 10/16/2011, 7:25 AM

## 2011-10-16 NOTE — Procedures (Signed)
Modified Barium Swallow Procedure Note Patient Details  Name: Bradley Prince MRN: [id number] Date of Birth: 06-26-1961  Today's Date: 10/16/2011 Time:  -     Past Medical History:  Past Medical History  Diagnosis Date  . Asthma   . Hypertension   . Hyperlipidemia   . Anxiety   . Tobacco abuse   . Peripheral arterial occlusive disease 2012    s/p PTCA/stent  . Coronary artery disease   . Mental disorder     histronic personality disorder   Past Surgical History:  Past Surgical History  Procedure Date  . Appendectomy   . Cholecystectomy   . Abdominal aortic aneurysm repair   . Renal artery stents    HPI:  51 y.o. male admitted 10/12/11 with chest pain, underwent cardiac cath, experienced onset of right sided weakness. CT inconclusive regarding left frontal lobe infarct. Patient reports globus as well as difficulty swallowing pills.  Bedside swallow eval on 1/28 revealed concern for aspiration and exacerbated by high anxiety.  Today's assessment revealed reflexsive cough response with thin liquids, therefore an objective swallow assessment was ordered.     Recommendation/Prognosis  Clinical Impression Dysphagia Diagnosis: Within Functional Limits Clinical impression: Demonstrates a normal oral-pharyngeal swallow without any difficulty or aspiration.  No coughing occurred in this study as observed earlier in today's bedside assessment.  Patient was not able to orally transit the barium pill due to fear, which he agreed is the cause of new swallow difficulties.  Noteworthy observation:  patient used his right upper extremity to self-feed throughout exam without any evidence of a functional weakness.  Patient agreed to crushed meds until his anxiety decreases.  Swallow Evaluation Recommendations Solid Consistency: Regular Liquid Consistency: Thin Liquid Administration via: Cup;Straw Medication Administration: Crushed with puree Supervision: Patient able to self  feed Postural Changes and/or Swallow Maneuvers: Seated upright 90 degrees;Upright 30-60 min after meal Follow up Recommendations: None   Individuals Consulted Consulted and Agree with Results and Recommendations: Patient;MD;RN  General:  Date of Onset: 10/12/11 Type of Study: Initial MBS Diet Prior to this Study: NPO Temperature Spikes Noted: No Respiratory Status: Room air History of Intubation: No Behavior/Cognition: Distractible;Other (comment) (High anxiety) Oral Cavity - Dentition: Adequate natural dentition Oral Motor / Sensory Function: Within functional limits Vision: Functional for self-feeding Patient Positioning: Upright in chair Baseline Vocal Quality: Clear Volitional Cough: Strong Volitional Swallow: Able to elicit Anatomy: Within functional limits Pharyngeal Secretions: Not observed secondary MBS  Oral Phase Oral Preparation/Oral Phase Oral Phase: WFL Pharyngeal Phase  Pharyngeal Phase Pharyngeal Phase: Within functional limits Cervical Esophageal Phase  Cervical Esophageal Phase Cervical Esophageal Phase: Telford Nab, M.S.,CCC-SLP Pager [phone] 10/16/2011, 10:43 AM

## 2011-10-16 NOTE — Progress Notes (Signed)
Pt. Seen and examined. Agree with the NP/PA-C note as written.  Agree, add statin and ASA when okay with neuro.  Chrystie Nose, MD Attending Cardiologist [facility]

## 2011-10-16 NOTE — Progress Notes (Signed)
[facility] PRO[facility]ESS NOTE    SUBJECTIVE   Patient stating his right arm hurts otherwise no complaints.  He states he has no recollection of the events that occurred yesterday.   OBJECTIVE   Vital signs in last 24 hours: Temp:  [97.3 F (36.3 C)-98.9 F (37.2 C)] 97.7 F (36.5 C) (01/29 0600) Pulse Rate:  [55-86] 67  (01/29 0600) Resp:  [16-18] 16  (01/29 0600) BP: (94-140)/(65-83) 123/79 mmHg (01/29 0600) SpO2:  [94 %-100 %] 94 % (01/29 0600)  Intake/Output from previous day: 01/28 0701 - 01/29 0700 In: 1155.2 [I.V.:1155.2] Out: 2850 [Urine:2850] Intake/Output this shift:   Nutritional status:    Past Medical History  Diagnosis Date  . Asthma   . Hypertension   . Hyperlipidemia   . Anxiety   . Tobacco abuse   . Peripheral arterial occlusive disease 2012    s/p PTCA/stent  . Coronary artery disease   . Mental disorder     histronic personality disorder    Neurologic Exam:   Mental Status: Alert, oriented, thought content appropriate.  Speech fluent without evidence of aphasia. Able to follow 3 step commands without difficulty. Cranial Nerves: II-Visual fields grossly intact. III/IV/VI-Extraocular movements intact.  Pupils reactive bilaterally. V/VII-Smile symmetric VIII-grossly intact IX/X-normal gag XI-bilateral shoulder shrug XII-midline tongue extension Motor: 5/5 bilaterally with normal tone and bulk.  He shows give ways strength on the right arm especially with shoulder abduction.  She also shows poor effort with right grip.  Bilateral hip flexion and bilateral DF/PF shows giveway and poor effort.  Sensory: Pinprick and light touch intact throughout, bilaterally Deep Tendon Reflexes: 2+ and symmetric throughout Plantars: Mute bilaterally Cerebellar: Normal finger-to-nose, normal rapid alternating movements and normal heel-to-shin test.    Lab Results: Lab Results  Component Value Date/Time   CHOL  221* 10/10/2011  5:34 PM   Lipid Panel No results found for this basename: CHOL,TRIG,HDL,CHOLHDL,VLDL,LDLCALC in the last 72 hours  Studies/Results: Ct Head Wo Contrast  10/15/2011  *RADIOLOGY REPORT*  Clinical Data: From the cath lab.  Right arm weakness.  CT HEAD WITHOUT CONTRAST  Technique:  Contiguous axial images were obtained from the base of the skull through the vertex without contrast.  Comparison: None.  Findings: Contrast from recent catheterization without obvious intracranial hemorrhage.  No intracranial enhancing lesion.  Asymmetric white matter type changes greater on the left frontal lobe.  This may be related to the small vessel disease type changes although acute infarct is not excluded given the right arm weakness.  MR imaging may prove helpful for further delineation.  Cerebellar tonsils minimally low-lying although within normal limits.  Partially empty sella may be present.  No hydrocephalus.  Vascular calcifications.  IMPRESSION: Asymmetric white matter type changes greater on the left frontal lobe.  This may be related to the small vessel disease type changes although acute infarct is not excluded given the right arm weakness.  MR imaging may prove helpful for further delineation.  Critical Value/emergent results were called by telephone at the time of interpretation on 10/07/2011  at 11/08.  to  Dr. Herbie Baltimore, who verbally acknowledged these results.  Original Report Authenticated By: Fuller Canada, M.D.    Medications:     Scheduled:   . amLODipine  10 mg Oral  Daily  . flumazenil  0.5 mg Intravenous Once  . flumazenil  0.5 mg Intravenous Once  . hydrALAZINE  5 mg Intravenous Once  . losartan  100 mg Oral Daily  . naloxone  0.4 mg Intravenous Once  . nicotine  14 mg Transdermal Daily  . rosuvastatin  20 mg Oral q1800  . sodium chloride  3 mL Intravenous Q12H  . DISCONTD: enoxaparin  40 mg Subcutaneous Q24H  . DISCONTD: hydrocortisone sod succinate (SOLU-CORTEF)  injection  25 mg Intravenous Q12H  . DISCONTD: isosorbide mononitrate  30 mg Oral Daily  . DISCONTD: levofloxacin  500 mg Oral Daily  . DISCONTD: ondansetron (ZOFRAN) IV  4 mg Intravenous Once  . DISCONTD: sodium chloride  3 mL Intravenous Q12H  . DISCONTD: sodium chloride  3 mL Intravenous Q12H    Assessment/Plan:    Patient Active Hospital Problem List:  Right sided weakness, R/O CVA after cath (10/16/2011)   Assessment: 51 YO male with onset of right sided weakness S/P cath.  Patient continues to complain of right arm weakness and discomfort.  CT head negative for acute stroke.  Exam shows inconsistency and give way strength/poor effort on right arm and bilateral leg flexion, DF/PF.   Plan:  1) LDL- 138 on crestor  2) 2D echo WNL  2) MRI brain PENDING  3) PT/OT evaluation PENDING   4) Neurology will follow MRI results. If unremarkable, no further work up or intervention recommended.      Felicie Morn PA-C [facility] [phone]  10/16/2011, 9:33 AM

## 2011-10-16 NOTE — Progress Notes (Signed)
Pt's bed alarm going off.  NT immediately went to room. Per NT, pt sitting in bed eating dinner.  She asked him if everything was ok, pt stated he fell and hit his right elbow on the floor. No abrasion, bruising, or redness noted on right elbow. Pt states "I'm ok, just ready to go home." Dr. Lavera Guise paged and made aware. No new orders received - ok to still discharge patient.

## 2011-10-16 NOTE — Progress Notes (Signed)
Subjective:  Says he is weak in his Rt arm and Rt leg, also trouble swallowing but says he just wants to go home (?). He won't do MRI without sedation. Failed bedside swallowing study.  Objective:  Vital Signs in the last 24 hours: Temp:  [97.3 F (36.3 C)-98.9 F (37.2 C)] 97.7 F (36.5 C) (01/29 0600) Pulse Rate:  [55-86] 67  (01/29 0600) Resp:  [16-18] 16  (01/29 0600) BP: (94-140)/(65-83) 123/79 mmHg (01/29 0600) SpO2:  [94 %-100 %] 94 % (01/29 0600)  Intake/Output from previous day:  Intake/Output Summary (Last 24 hours) at 10/16/11 0857 Last data filed at 10/16/11 0030  Gross per 24 hour  Intake 1155.24 ml  Output   2850 ml  Net -1694.76 ml    Physical Exam: General appearance: alert, cooperative and Rt arm and Rt leg weakness. Lungs: clear to auscultation bilaterally Heart: regular rate and rhythm Rt groin without hematoma   Rate: 68  Rhythm: normal sinus rhythm  Lab Results:  Basename 10/15/11 0535  WBC 8.9  HGB 12.4*  PLT 276   No results found for this basename: NA:2,K:2,CL:2,CO2:2,GLUCOSE:2,BUN:2,CREATININE:2 in the last 72 hours  Basename 10/13/11 1656  TROPONINI <0.30   Hepatic Function Panel No results found for this basename: PROT,ALBUMIN,AST,ALT,ALKPHOS,BILITOT,BILIDIR,IBILI in the last 72 hours No results found for this basename: CHOL in the last 72 hours  Basename 10/15/11 0535  INR 1.05    Imaging: Ct Head Wo Contrast  10/15/2011  *RADIOLOGY REPORT*  Clinical Data: From the cath lab.  Right arm weakness.  CT HEAD WITHOUT CONTRAST  Technique:  Contiguous axial images were obtained from the base of the skull through the vertex without contrast.  Comparison: None.  Findings: Contrast from recent catheterization without obvious intracranial hemorrhage.  No intracranial enhancing lesion.  Asymmetric white matter type changes greater on the left frontal lobe.  This may be related to the small vessel disease type changes although acute infarct is not  excluded given the right arm weakness.  MR imaging may prove helpful for further delineation.  Cerebellar tonsils minimally low-lying although within normal limits.  Partially empty sella may be present.  No hydrocephalus.  Vascular calcifications.  IMPRESSION: Asymmetric white matter type changes greater on the left frontal lobe.  This may be related to the small vessel disease type changes although acute infarct is not excluded given the right arm weakness.  MR imaging may prove helpful for further delineation.  Critical Value/emergent results were called by telephone at the time of interpretation on 10/07/2011  at 11/08.  to  Dr. Herbie Baltimore, who verbally acknowledged these results.  Original Report Authenticated By: Fuller Canada, M.D.    Cardiac Studies:  Assessment/Plan:   Principal Problem:  *Chest pain, cath this adm. Mild CAD  Active Problems:  Right sided weakness, R/O CVA after cath, CT head was abn, MRI not done yet as pt c/o claustraphobia. He did fail bedside swallowing study.   Anxiety  Hx of thoracic aortic aneurysm repair  Hypertension  Tobacco abuse  Hyperlipidemia  Peripheral arterial occlusive disease - Renal Atery Stenosis - s/p STENT  Peripheral vascular disease  Coronary artery disease, mild by cath this adm  Plan- He may agree to MRI with sedation, will defer to Neuro. Add statin, add ASA if OK with neuro. MD to see.    Corine Shelter PA-C 10/16/2011, 8:57 AM

## 2011-10-16 NOTE — Evaluation (Signed)
Physical Therapy Evaluation Patient Details Name: Bradley Prince MRN: [id number] DOB: 07/03/1961 Today's Date: 10/16/2011  Problem List:  Patient Active Problem List  Diagnoses  . Hypertension  . Tobacco abuse  . Tobacco abuse counseling  . Peripheral vascular disease  . Hyperlipidemia  . Anxiety  . 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 Coronary artery disease, mild by cath this adm  . Chest pain  . Hx of thoracic aortic aneurysm repair  . Right sided weakness, R/O CVA after cath    Past Medical History:  Past Medical History  Diagnosis Date  . Asthma   . Hypertension   . Hyperlipidemia   . Anxiety   . Tobacco abuse   . Peripheral arterial occlusive disease 2012    s/p PTCA/stent  . Coronary artery disease   . Mental disorder     histronic personality disorder   Past Surgical History:  Past Surgical History  Procedure Date  . Appendectomy   . Cholecystectomy   . Abdominal aortic aneurysm repair   . Renal artery stents     PT Assessment/Plan/Recommendation PT Assessment Clinical Impression Statement: Patient is a Bradley Prince male with work up for CVA however all tests negative.  Patient presents with R UE and LE weakness when asked to do something but noted to use them functionally when noone is in the room.  Spoke with MD who will give pt. a prescription for Outpt. PT as this would be PT recommendation given presentation on eval.  Patient self corrects when he does lose his balance therefore do not feel there is a safety issue.   PT Recommendation/Assessment: Patient will need skilled PT in the acute care venue PT Problem List: Decreased activity tolerance;Decreased mobility;Decreased safety awareness PT Therapy Diagnosis : Difficulty walking PT Plan PT Frequency: Min 3X/week PT Treatment/Interventions: Gait training;Functional mobility training;Balance training;Patient/family education PT Recommendation Follow Up  Recommendations: Outpatient PT Equipment Recommended: None recommended by PT PT Goals  Acute Rehab PT Goals PT Goal Formulation: With patient Time For Goal Achievement: 7 days Pt will Ambulate: >150 feet;with modified independence PT Goal: Ambulate - Progress: Goal set today Pt will Go Up / Down Stairs: 3-5 stairs;with modified independence PT Goal: Up/Down Stairs - Progress: Goal set today  PT Evaluation Precautions/Restrictions  Precautions Precautions: Fall Required Braces or Orthoses: No Restrictions Weight Bearing Restrictions: No Prior Functioning  Home Living Lives With: Spouse Receives Help From: Family Type of Home: House Home Layout: One level Home Access: Stairs to enter Entrance Stairs-Rails: Can reach both Entrance Stairs-Number of Steps: 4 Bathroom Shower/Tub: Health visitor: Standard Bathroom Accessibility: Yes How Accessible: Accessible via walker Home Adaptive Equipment: None Prior Function Level of Independence: Independent with basic ADLs;Independent with homemaking with ambulation;Independent with gait;Independent with transfers Driving: Yes Vocation: Full time employment Cognition Cognition Arousal/Alertness: Awake/alert Overall Cognitive Status: Appears within functional limits for tasks assessed Orientation Level: Oriented to person;Oriented to place;Oriented to situation;Disoriented to time Cognition - Other Comments: PAtient could not remember his birthday.  Had discussion with patient about return to work as patient stated he was returning right away. PT asked patient, "How will you get there?"  Patient stated that he would"drive himself".  PT reminded pt. about his weakness and asked if he thought he could drive safely when he could not even use his right UE to eat (curently eating with only left hand).  Pt. reports he would be fine to dirive.  Once PT went out  of room, noted pt. using right arm to eat ice cream and fluff pillows.     Sensation/Coordination Sensation Light Touch: Appears Intact Stereognosis: Not tested Hot/Cold: Not tested Proprioception: Not tested Coordination Gross Motor Movements are Fluid and Coordinated: Yes Fine Motor Movements are Fluid and Coordinated: Yes Extremity Assessment RUE Assessment RUE Assessment: Exceptions to [facility] RUE Strength RUE Overall Strength: Deficits (feel patient can move UE to [facility] but is not doing so, MD awar) LUE Assessment LUE Assessment: Within Functional Limits RLE Assessment RLE Assessment: Within Functional Limits LLE Assessment LLE Assessment: Within Functional Limits Mobility (including Balance) Bed Mobility Bed Mobility: Yes Rolling Right: 6: Modified independent (Device/Increase time) Right Sidelying to Sit: 6: Modified independent (Device/Increase time) Transfers Transfers: Yes Sit to Stand: 6: Modified independent (Device/Increase time) Stand to Sit: 6: Modified independent (Device/Increase time) Ambulation/Gait Ambulation/Gait: Yes Ambulation/Gait Assistance: 6: Modified independent (Device/Increase time) Ambulation Distance (Feet): 50 Feet Assistive device: None Gait Pattern: Decreased step length - right;Decreased dorsiflexion - right;Antalgic Gait velocity: Slow cadence  Mild LOB noted which pt. self corrected. Stairs: No Corporate treasurer: No  Posture/Postural Control Posture/Postural Control: No significant limitations Balance Balance Assessed: Yes Dynamic Standing Balance Dynamic Standing - Balance Support: Left upper extremity supported Dynamic Standing - Level of Assistance: 5: Stand by assistance Dynamic Standing - Balance Activities: Reaching across midline;Forward lean/weight shifting High Level Balance High Level Balance Activites: Turns (with a little sway but corrects balance independently) Exercise    End of Session PT - End of Session Equipment Utilized During Treatment: Gait belt Activity  Tolerance: Patient limited by fatigue Patient left: in chair;with call bell in reach Nurse Communication: Mobility status for transfers;Mobility status for ambulation General Behavior During Session: [facility] for tasks performed Cognition: [facility] for tasks performed  INGOLD,Coree Brame 10/16/2011, 4:21 PM [facility] [phone] (pager)

## 2011-10-16 NOTE — Progress Notes (Signed)
Speech Pathology: Dysphagia Treatment Note  Subjective: Awake, alert  Objective: Treatment focused on diagnostic-treatment of swallow function. Patient anxious, perseverative on "I want to go home today and go to work tomorrow" despite right sided UE weakness and not functional use of right side of body.  Poor cognition in this brief session. PO trials of thin water, puree, and cracker with immediate coughs in 50% of thin water sips and patient c/o globus in his pharynx as well as "a sore throat."  Suspicious for UES dysfunction versus anxiety precipitated symptoms.  MD and patient in agreement with an objective test.  Assessment: Objective assessment to determine swallow function as related to his signs/symptoms.  Recommendations:  1. NPO. 2. MBSS today at 1000  Pain:   none Intervention Required:   No  Goals: progressing  Myra Rude, M.S.,CCC-SLP Pager [phone]

## 2011-10-16 NOTE — Discharge Summary (Signed)
Patient ID: Bradley Prince MRN: [id number] DOB/AGE: Apr 08, 1961 51 y.o. Primary Care Physician:TULLO,TERESA, MD, MD Admit date: 10/12/2011 Discharge date: 10/16/2011    Discharge Diagnoses:   Principal Problem:  *Chest pain Active Problems:  Hypertension  Tobacco abuse  Peripheral vascular disease  Hyperlipidemia  Anxiety  Peripheral arterial occlusive disease - Renal Atery Stenosis - s/p STENT  Coronary artery disease, mild by cath this adm  Hx of thoracic aortic aneurysm repair  Right sided weakness, R/O CVA after cath - MRI negative for acute stroke.    Medication List  As of 10/16/2011  3:41 PM   START taking these medications         nicotine 14 mg/24hr patch   Commonly known as: NICODERM CQ - dosed in mg/24 hours   Place 1 patch onto the skin daily.         CONTINUE taking these medications         amLODipine 10 MG tablet   Commonly known as: NORVASC      aspirin 81 MG tablet      HYDROcodone-acetaminophen 5-500 MG per tablet   Commonly known as: VICODIN      losartan 100 MG tablet   Commonly known as: COZAAR         STOP taking these medications         levofloxacin 500 MG tablet      predniSONE 10 MG tablet          Where to get your medications    These are the prescriptions that you need to pick up.   You may get these medications from any pharmacy.         nicotine 14 mg/24hr patch            Discharged Condition:good    Consults: [facility] - Dr. Herbie Baltimore  Neuro - Dr. Thad Ranger  Significant Diagnostic Studies: Dg Chest 2 View  10/12/2011  *RADIOLOGY REPORT*  Clinical Data: Shortness of breath  CHEST - 2 VIEW  Comparison: None.  Findings: Prior median sternotomy.  Cardiomegaly.  No confluent airspace opacities, effusions or edema.  No acute bony abnormality.  IMPRESSION: Cardiomegaly.  No active disease.  Original Report Authenticated By: Cyndie Chime, M.D.   Ct Head Wo Contrast  10/15/2011  *RADIOLOGY REPORT*  Clinical Data:  From the cath lab.  Right arm weakness.  CT HEAD WITHOUT CONTRAST  Technique:  Contiguous axial images were obtained from the base of the skull through the vertex without contrast.  Comparison: None.  Findings: Contrast from recent catheterization without obvious intracranial hemorrhage.  No intracranial enhancing lesion.  Asymmetric white matter type changes greater on the left frontal lobe.  This may be related to the small vessel disease type changes although acute infarct is not excluded given the right arm weakness.  MR imaging may prove helpful for further delineation.  Cerebellar tonsils minimally low-lying although within normal limits.  Partially empty sella may be present.  No hydrocephalus.  Vascular calcifications.  IMPRESSION: Asymmetric white matter type changes greater on the left frontal lobe.  This may be related to the small vessel disease type changes although acute infarct is not excluded given the right arm weakness.  MR imaging may prove helpful for further delineation.  Critical Value/emergent results were called by telephone at the time of interpretation on 10/07/2011  at 11/08.  to  Dr. Herbie Baltimore, who verbally acknowledged these results.  Original Report Authenticated By: Fuller Canada, M.D.   Ct Angio  Chest W/cm &/or Wo Cm  10/12/2011  *RADIOLOGY REPORT*  Clinical Data: Chest pain past 3 days.  Prior CABG.  Smoker. Cough.  Asthma. Shortness of breath.  CT ANGIOGRAPHY CHEST  Technique:  Multidetector CT imaging of the chest using the standard protocol during bolus administration of intravenous contrast. Multiplanar reconstructed images including MIPs were obtained and reviewed to evaluate the vascular anatomy.  Contrast: 50mL OMNIPAQUE IOHEXOL 350 MG/ML IV SOLN  Comparison: 10/12/2011 chest x-ray.  No earlier exams.  Findings: Artifact extends thru the pulmonary arteries.  No pulmonary embolus noted.  Prominence of the main pulmonary arteries may be related to component of pulmonary  hypertension or possibly congenital in origin.  Post CABG.  Proximal right coronary artery calcifications.  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  No evidence of dissection.  Minimal atelectatic changes/linear scarring without focal worrisome lung mass.  Trachea and mainstem bronchi are patent.  No mediastinal, hilar or axillary adenopathy.  No bony destructive lesion.  3 cm liver cyst.  Post cholecystectomy.  Remainder of  visualized upper abdominal structures unremarkable.  IMPRESSION: Artifact extends thru the pulmonary arteries.  No pulmonary embolus noted.  Prominence of the main pulmonary arteries may be related to component of pulmonary hypertension or possibly congenital in origin.  Post CABG.  Proximal right coronary artery calcifications.  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  Original Report Authenticated By: Fuller Canada, M.D.   Mr [name] Head Wo Contrast  10/16/2011  *RADIOLOGY REPORT*  Clinical Data:  51 year old male with right arm weakness following cardiac catheterization.  Stroke.  Comparison: Head CT without contrast 10/15/2011.  MRI HEAD WITHOUT CONTRAST  Technique: Multiplanar, multiecho pulse sequences of the brain and surrounding structures were obtained according to standard protocol without intravenous contrast.  Findings: No restricted diffusion to suggest acute infarction.  No midline shift, mass effect, evidence of mass lesion, ventriculomegaly, extra-axial collection or acute intracranial hemorrhage.  Cervicomedullary junction and pituitary are within normal limits.  Major intracranial vascular flow voids are preserved.  Age advanced and confluent cerebral white matter T2 and FLAIR hyperintensity primarily is subcortical.  The temporal lobes are mildly involved. No cortical encephalomalacia identified.  Deep gray  matter nuclei, brainstem, and cerebellum are within normal limits.  Negative visualized cervical spine.  Visualized bone marrow signal is within normal limits.  Visualized orbit soft tissues are within normal limits.  Visualized paranasal sinuses and mastoids are clear.  Negative scalp soft tissues.  IMPRESSION: 1. No acute intracranial abnormality. 2.  Age advanced but nonspecific white matter changes. Differential considerations include accelerated/hereditary small vessel ischemia, sequelae of trauma, hypercoagulable state, vasculitis, migraines, prior infection or demyelination. 3.  See MRA findings below.  MRA HEAD WITHOUT CONTRAST  Technique: Angiographic images of the Circle of Willis were obtained using MRA technique without  intravenous contrast.  Findings: Antegrade flow in the posterior circulation.  Relatively codominant distal vertebral arteries.  Normal PICA vessels. Tortuous vertebrobasilar junction.  No distal vertebral or basilar artery stenosis.  Superior cerebellar arteries and PCA origins are within normal limits.  Normal right posterior communicating artery, the left is diminutive or absent.  Bilateral PCA branches are within normal limits.  Antegrade flow in both ICA siphons.  No ICA stenosis is identified. There is a 2 mm saccular lesion extending laterally from the right cavernous ICA segment (  series 6 image 78.  Normal ophthalmic artery origins.  Normal right posterior communicating artery origin.  Normal carotid termini, MCA and ACA origins.  Diminutive anterior communicating artery. Visualized ACA branches are within normal limits.  Visualized bilateral MCA branches are within normal limits; simplified left MCA branching pattern is noted.  IMPRESSION: 1.  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 This appears to be extradural (within the cavernous segment of the vessel). 2.  Otherwise negative intracranial MRA.  No intracranial stenosis or major branch  occlusion identified.  Original Report Authenticated By: Harley Hallmark, M.D.   Mr Brain Wo Contrast  10/16/2011  *RADIOLOGY REPORT*  Clinical Data:  51 year old male with right arm weakness following cardiac catheterization.  Stroke.  Comparison: Head CT without contrast 10/15/2011.  MRI HEAD WITHOUT CONTRAST  Technique: Multiplanar, multiecho pulse sequences of the brain and surrounding structures were obtained according to standard protocol without intravenous contrast.  Findings: No restricted diffusion to suggest acute infarction.  No midline shift, mass effect, evidence of mass lesion, ventriculomegaly, extra-axial collection or acute intracranial hemorrhage.  Cervicomedullary junction and pituitary are within normal limits.  Major intracranial vascular flow voids are preserved.  Age advanced and confluent cerebral white matter T2 and FLAIR hyperintensity primarily is subcortical.  The temporal lobes are mildly involved. No cortical encephalomalacia identified.  Deep gray matter nuclei, brainstem, and cerebellum are within normal limits.  Negative visualized cervical spine.  Visualized bone marrow signal is within normal limits.  Visualized orbit soft tissues are within normal limits.  Visualized paranasal sinuses and mastoids are clear.  Negative scalp soft tissues.  IMPRESSION: 1. No acute intracranial abnormality. 2.  Age advanced but nonspecific white matter changes. Differential considerations include accelerated/hereditary small vessel ischemia, sequelae of trauma, hypercoagulable state, vasculitis, migraines, prior infection or demyelination. 3.  See MRA findings below.  MRA HEAD WITHOUT CONTRAST  Technique: Angiographic images of the Circle of Willis were obtained using MRA technique without  intravenous contrast.  Findings: Antegrade flow in the posterior circulation.  Relatively codominant distal vertebral arteries.  Normal PICA vessels. Tortuous vertebrobasilar junction.  No distal vertebral or  basilar artery stenosis.  Superior cerebellar arteries and PCA origins are within normal limits.  Normal right posterior communicating artery, the left is diminutive or absent.  Bilateral PCA branches are within normal limits.  Antegrade flow in both ICA siphons.  No ICA stenosis is identified. There is a 2 mm saccular lesion extending laterally from the right cavernous ICA segment (series 6 image 78.  Normal ophthalmic artery origins.  Normal right posterior communicating artery origin.  Normal carotid termini, MCA and ACA origins.  Diminutive anterior communicating artery. Visualized ACA branches are within normal limits.  Visualized bilateral MCA branches are within normal limits; simplified left MCA branching pattern is noted.  IMPRESSION: 1.  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 This appears to be extradural (within the cavernous segment of the vessel). 2.  Otherwise negative intracranial MRA.  No intracranial stenosis or major branch occlusion identified.  Original Report Authenticated By: Harley Hallmark, M.D.   Dg Swallowing Func-no Report  10/16/2011  CLINICAL DATA: objective assessment of swallow function   FLUOROSCOPY FOR SWALLOWING FUNCTION STUDY:  Fluoroscopy was provided for swallowing function study, which was  administered by a speech pathologist.  Final results and recommendations  from this study are contained within the speech pathology report.      Lab Results: Results for orders placed during  the hospital encounter of 10/12/11 (from the past 48 hour(s))  CBC     Status: Abnormal   Collection Time   10/15/11  5:35 AM      Component Value Range Comment   WBC 8.9  4.0 - 10.5 (K/uL)    RBC 4.31  4.22 - 5.81 (MIL/uL)    Hemoglobin 12.4 (*) 13.0 - 17.0 (g/dL)    HCT 47.8 (*) 29.5 - 52.0 (%)    MCV 89.8  78.0 - 100.0 (fL)    MCH 28.8  26.0 - 34.0 (pg)    MCHC 32.0  30.0 - 36.0 (g/dL)    RDW 62.1  30.8 - 65.7 (%)    Platelets 276  150 - 400  (K/uL)   PROTIME-INR     Status: Normal   Collection Time   10/15/11  5:35 AM      Component Value Range Comment   Prothrombin Time 13.9  11.6 - 15.2 (seconds)    INR 1.05  0.00 - 1.49    APTT     Status: Normal   Collection Time   10/15/11  5:35 AM      Component Value Range Comment   aPTT 30  24 - 37 (seconds)   GLUCOSE, CAPILLARY     Status: Normal   Collection Time   10/15/11  9:55 AM      Component Value Range Comment   Glucose-Capillary 77  70 - 99 (mg/dL)    No results found for this or any previous visit (from the past 240 hour(s)).   Results for TAIVON, HAROON (MRN [id number]) as of 10/14/2011 08:12   Ref. Range  10/12/2011 15:23  10/12/2011 15:24  10/12/2011 19:04  10/13/2011 06:30  10/13/2011 16:56   CK, MB  Latest Range: 0.3-4.0 ng/mL    1.7   1.6   CK Total  Latest Range: 7-232 U/L    62   78   Troponin I  Latest Range: <0.30 ng/mL  <0.30   <0.30   <0.30    Performing Cardiologist: Marykay Lex, MD, MS  Primary Physician: Duncan Dull, MD, MD  Primary Cardiologist: Marykay Lex, MD, MS  Procedures Performed:  Left Heart Catheterization via 5 Fr Right Common Femoral Artery access  Left Ventriculography, (RAO) 11 ml/sec for 33 ml total contrast  Native Coronary Angiography  Indication(s): Recurrent Left Sided Chest Pain at rest  HTN, HLD & Smoker  S/p TAA repair  S/p RAS stenting History: 51 y.o. male a past medical history significant for thoracic aortic aneurysm status post dacryon graft replacement 1996, renal artery stenosis status post stenting in 2012 as well as hypertension hyperlipidemia and chronic smoking who was in the emergency room at the recommendation of his primary physician for intermittent chest pain lasting since Monday (1/21). To the admitting physician he noted the pain was sharp pain in his left chest radiating to his left shoulder. To me he describes it as a heaviness in his chest. Monday he felt as though he was developing a cold and felt  that portable heaviness in his chest. Then it would come and go and last up to 20 minutes this I was not so she was short of breath. Today he felt better with having less frequently but Wednesday came back more significantly associated with shortness of breath and nausea. He denies any coughing or wheezing, but when he went to go see his primary physician, she noted wheezing and started him on antibiotics and steroids which had little  help. By the time he arrived at her office his symptoms abated. He took today off on Thursday but continued to have intermittent symptoms they said would last up to 20 minutes At a time would come, and never truly go away. He awoke Friday morning around 3:00 with this pain and shortness of breath and it radiated to his left shoulder. After this away he went back to sleep however when he got up to go to his car to drive to work, he had recurrence of symptoms which prompted her to call her physician. She recommended they called the 911, and he was brought to the emergency room. He is being admitted to [facility].  After consultation, the decision was made to proceed with diagnostic cardiac catheterization based upon his RFs & CTA of chest with ? RCA disease.  Consent: The procedure with Risks/Benefits/Alternatives and Indications was reviewed with the patient. All questions were answered.  Risks / Complications include, but not limited to: Death, MI, CVA/TIA, VF/VT (with defibrillation), Bradycardia (need for temporary pacer placement), contrast induced nephropathy, bleeding / bruising / hematoma / pseudoaneurysm, vascular or coronary injury (with possible emergent CT or Vascular Surgery), adverse medication reactions, infection.  The patient voiced understanding and agree to proceed.  Consent for signed by MD and patient with RN witness -- placed on chart.  Procedure: The patient was brought to the 2nd Floor  Cardiac Catheterization Lab in the fasting state  and prepped and draped in the usual sterile fashion for Right groin access. Sterile technique was used including antiseptics, cap, gloves, gown, hand hygiene, mask and sheet.  Skin prep: Chlorhexidine;  Time Out: Verified patient identification, verified procedure, site/side was marked, verified correct patient position, special equipment/implants available, medications/allergies/relevent history reviewed, required imaging and test results available. Performed  The right femoral head was identified using tactile and fluoroscopic technique. The right groin was anesthetized with 1% subcutaneous Lidocaine. The right Common Femoral Artery was accessed using the Modified Seldinger Technique with placement of a antimicrobial bonded/coated single lumen (5 Fr) sheath. The sheath was aspirated and flushed.  A 5 Fr JL4 Catheter was advanced of over a Standard J wire into the ascending Aorta. The catheter was used to engage the Left Coronary Artery. Multiple cineangiographic views of the Left Coronary Artery system(s) were performed.  A 5 Fr JR4 Catheter was advanced of over a Standard J wire into the ascending Aorta. The catheter was used to engage the Right Coronary Artery. Multiple cineangiographic views of the Right Coronary Artery system were performed.  This catheter was then exchanged over the standard J wire for an angled Pigtail catheter that was advanced across the Aortic Valve. LV hemodynamics were measured,and and andLeft Ventriculography was performed. LV hemodynamics were then re-sampled, and the catheter was pulled back across the Aortic Valve for measurement of "pull-back" gradient. The catheter and the wire were removed completely out of the body.  The patient was transferred to the holding area where the sheath was removed with manual pressure held for hemostasis.  I the holding area,the patient became less responsive - with concern for R sided weakness. 1 round of Narcan & 2 doses of Rumazecon were  given. Patient is now more arousable & moving all extremities. Have as asked for Neurology input.  The patient was transported to the PACU in moderately sedated, but hemodynamically stable condition.  The patient was stable before, during and following the procedure - until arrival to PACU.  Patient did tolerate procedure  well.  There were not complications during the procedure & the procedure went smoothly.  EBL: <58ml  Medications:  Premedication: 5mg  Valium (apparently given IV Ativan @ ~0600)  Sedation: 2 mg IV Versed, 50 IV mcg Fentanyl  Contrast: 75ml Omnipaque  Hemodynamics:  Central Aortic Pressure / Mean Aortic Pressure: 126/75 mmHg, 98 mmhg  LV Pressure / LV End diastolic Pressure: 120/11 mmHg,  Left Ventriculography:  EF: 60-65%  Wall Motion: Normal  Coronary Angiographic Data: Right Dominant  Left Main: Large caliber, angiographically normal; bifurcates into LAD, LCx  Left Anterior Descending (LAD): Moderate-to-large caliber proximal vessel with very proximal small D1; both the ostial/proximal LAD & Diag have ~40% stenosis. The LAD then essentially bifurcates into equal sized D2 & distal LAD. D2 has several branches with no significant disease. The Distal LAD tapers to the apex & gives rise to a small D2 from the distal mid vessel.  Circumflex (LCx): Large caliber, non-dominant vessel, gives rise to a very proximal small OM1 with ~50% ostial stenosis as well as a proximal atrial branch before branching into a bifurcating Moderate caliber OM (superior branch larger & bifurcating) and small caliber AV Groove artery. No significant disease in the remainder of the circumflex system  Right Coronary Artery: Large dominant vessel roughly 6 mm proximal has a moderate caliber marginal branch before bifurcating the relatively high bifurcation gives rise to a moderate to large caliber posterior descending artery and posterior atrioventricular groove vessel. No significant CAD this vessel.  The ostium has a somewhat anterior takeoff.  RPDA: Moderate to large caliber vessel, bifurcates the distal portion; minimal luminal irregularities  Right Posterior Atrioventricular Groove Branch: large caliber vessel that gives rise to a small posterior lateral branch followed by bifurcating into 2 larger posterior lateral branches an extensive distribution. No significant disease. Impression:  1. Essentially normal coronary arteries these are going moderate disease in the ostial LAD ostial D1 and ostial OM1. No lesions to explain resting chest pain.  2. Well-preserved, vigorous Left Ventricular Ejection Fraction with no regional wall motion abnormalities  3. Most likely etiology of chest pain is noncardiac.  Plan:  I have asked the neurology team to evaluate for his altered mental status however my associates of this is due to sedation medications as he received significant doses of benzodiazepines and narcotics as responded to reversal agents.  Transfer back to his bed following sheath removal.  Continue risk factor modification with blood pressure and lipid control.  Anticipate discharge based on hospitalist service assessment of noncardiac chest pain. The case and results was discussed with the patient (and family).  The case and results was discussed with the patient's Cardiologist      Hospital Course:  51 yo man with hx of TAA repair presented to the ED with atypical chest pain. CE wnl x3. Chest pain remained persistent and CTA chest was negative for aortic dissection or PE. On CT coronary calcifications were seen and the patient was kept over the weekend for diagnostic cardiac cath on 10/15/11.  On 10/14/11 the patient c/o throat feeling swollen - unclear which might be the culprit medication. He took crestor and vicodin before going to bed. We stopped Crestor AND THE SYMPTOMS RESOLVED.   The patient had cardiac cath on 10/15/11 with minimal disease being found. The patient developed right  side weakness post cath and was evaluated by neurology on 10/15/11. His symptoms seemed functional in nature and the MRI confirmed absence of an acute stroke.  Plan is for  the patient to DC home without any changes in his home medications     Discharge Exam: Blood pressure 144/89, pulse 79, temperature 98.6 F (37 C), temperature source Oral, resp. rate 18, height 5\' 11"  (1.803 m), weight 102.105 kg (225 lb 1.6 oz), SpO2 97.00%.  Alert and oriented x3 Cvs: rrr Rs: ctab  Abdomen: soft , nt  Disposition: home  Discharge Orders    Future Orders Please Complete By Expires   Diet - low sodium heart healthy      Increase activity slowly         Follow-up Information    Schedule an appointment as soon as possible for a visit with Duncan Dull, MD.   Contact information:   [address] [place] [address] [phone]          Signed: Lonia Blood 10/16/2011, 3:41 PM

## 2011-11-30 ENCOUNTER — Telehealth: Payer: Self-pay | Admitting: Internal Medicine

## 2011-11-30 NOTE — Telephone Encounter (Signed)
Left message with patient stating if he is coughing up blood then he needs to go to ED.

## 2011-11-30 NOTE — Telephone Encounter (Signed)
[facility] Triage Call Report Triage Record Num: [id number] Operator: Freddie Breech Patient Name: Bradley Prince Call Date & Time: 11/30/2011 10:28:32AM Patient Phone: [phone] PCP: Duncan Dull Patient Gender: Male PCP Fax : [phone] Patient DOB: 1961-05-05 Practice Name: [facility] Reason for Call: Caller: [place]/Patient; PCP: Duncan Dull; CB#: [phone]; ; ; Call regarding Cough/Congestion; Cough/sinus congestion onset 11/25/11. Cough worsened overnight, coughing up yellow blood streaked sputum. Advised ED or UC now per Cough Protocol. Protocol(s) Used: Cough - Adult Recommended Outcome per Protocol: See ED Immediately Reason for Outcome: Coughing up large amount of obvious blood (not blood-streaked sputum) Care Advice: ~ Another adult should drive. Write down provider's name. List or place the following in a bag for transport with the patient: current prescription and/or nonprescription medications; alternative treatments, therapies and medications; and street drugs. ~ 11/30/2011 10:34:39AM Page 1 of 1 [id number]

## 2012-04-11 ENCOUNTER — Encounter: Payer: Self-pay | Admitting: *Deleted

## 2012-04-17 ENCOUNTER — Encounter: Payer: Self-pay | Admitting: Internal Medicine

## 2012-12-26 DIAGNOSIS — F411 Generalized anxiety disorder: Secondary | ICD-10-CM | POA: Insufficient documentation

## 2013-02-02 DIAGNOSIS — G4733 Obstructive sleep apnea (adult) (pediatric): Secondary | ICD-10-CM | POA: Insufficient documentation

## 2013-02-05 DIAGNOSIS — I6990 Unspecified sequelae of unspecified cerebrovascular disease: Secondary | ICD-10-CM | POA: Insufficient documentation

## 2013-02-11 DIAGNOSIS — I672 Cerebral atherosclerosis: Secondary | ICD-10-CM | POA: Insufficient documentation

## 2013-03-14 DIAGNOSIS — I2510 Atherosclerotic heart disease of native coronary artery without angina pectoris: Secondary | ICD-10-CM | POA: Insufficient documentation

## 2013-03-15 DIAGNOSIS — I219 Acute myocardial infarction, unspecified: Secondary | ICD-10-CM | POA: Insufficient documentation

## 2013-04-24 DIAGNOSIS — I119 Hypertensive heart disease without heart failure: Secondary | ICD-10-CM | POA: Insufficient documentation

## 2013-06-08 ENCOUNTER — Emergency Department (HOSPITAL_COMMUNITY): Payer: BC Managed Care – PPO

## 2013-06-08 ENCOUNTER — Encounter (HOSPITAL_COMMUNITY): Payer: Self-pay

## 2013-06-08 ENCOUNTER — Observation Stay (HOSPITAL_COMMUNITY): Payer: BC Managed Care – PPO

## 2013-06-08 ENCOUNTER — Inpatient Hospital Stay (HOSPITAL_COMMUNITY)
Admission: EM | Admit: 2013-06-08 | Discharge: 2013-06-12 | DRG: 247 | Disposition: A | Discharge status: Home / Self Care | Payer: BC Managed Care – PPO | Attending: Internal Medicine | Admitting: Internal Medicine

## 2013-06-08 DIAGNOSIS — F17200 Nicotine dependence, unspecified, uncomplicated: Secondary | ICD-10-CM | POA: Diagnosis present

## 2013-06-08 DIAGNOSIS — Z8673 Personal history of transient ischemic attack (TIA), and cerebral infarction without residual deficits: Secondary | ICD-10-CM

## 2013-06-08 DIAGNOSIS — E785 Hyperlipidemia, unspecified: Secondary | ICD-10-CM | POA: Diagnosis present

## 2013-06-08 DIAGNOSIS — Z7982 Long term (current) use of aspirin: Secondary | ICD-10-CM

## 2013-06-08 DIAGNOSIS — I10 Essential (primary) hypertension: Secondary | ICD-10-CM

## 2013-06-08 DIAGNOSIS — H539 Unspecified visual disturbance: Secondary | ICD-10-CM | POA: Insufficient documentation

## 2013-06-08 DIAGNOSIS — R51 Headache: Secondary | ICD-10-CM | POA: Diagnosis present

## 2013-06-08 DIAGNOSIS — R110 Nausea: Secondary | ICD-10-CM

## 2013-06-08 DIAGNOSIS — Z79899 Other long term (current) drug therapy: Secondary | ICD-10-CM

## 2013-06-08 DIAGNOSIS — M47812 Spondylosis without myelopathy or radiculopathy, cervical region: Secondary | ICD-10-CM | POA: Diagnosis present

## 2013-06-08 DIAGNOSIS — R29898 Other symptoms and signs involving the musculoskeletal system: Principal | ICD-10-CM | POA: Diagnosis present

## 2013-06-08 DIAGNOSIS — R209 Unspecified disturbances of skin sensation: Secondary | ICD-10-CM

## 2013-06-08 DIAGNOSIS — B370 Candidal stomatitis: Secondary | ICD-10-CM | POA: Diagnosis present

## 2013-06-08 DIAGNOSIS — I6789 Other cerebrovascular disease: Secondary | ICD-10-CM

## 2013-06-08 DIAGNOSIS — M6281 Muscle weakness (generalized): Secondary | ICD-10-CM

## 2013-06-08 DIAGNOSIS — R531 Weakness: Secondary | ICD-10-CM | POA: Diagnosis present

## 2013-06-08 DIAGNOSIS — G459 Transient cerebral ischemic attack, unspecified: Secondary | ICD-10-CM

## 2013-06-08 DIAGNOSIS — H538 Other visual disturbances: Secondary | ICD-10-CM

## 2013-06-08 DIAGNOSIS — R200 Anesthesia of skin: Secondary | ICD-10-CM | POA: Diagnosis present

## 2013-06-08 HISTORY — DX: Transient cerebral ischemic attack, unspecified: G45.9

## 2013-06-08 LAB — COMPREHENSIVE METABOLIC PANEL
ALT: 20 U/L (ref 0–53)
AST: 18 U/L (ref 0–37)
Albumin: 4 g/dL (ref 3.5–5.2)
Alkaline Phosphatase: 139 U/L — ABNORMAL HIGH (ref 39–117)
BUN: 9 mg/dL (ref 6–23)
Calcium: 9.6 mg/dL (ref 8.4–10.5)
GFR calc Af Amer: >90 mL/min (ref >90)
Potassium: 3.9 mEq/L (ref 3.5–5.1)
Sodium: 136 mEq/L (ref 135–145)
Total Protein: 7.4 g/dL (ref 6.0–8.3)

## 2013-06-08 LAB — TROPONIN I: Troponin I: <0.3 ng/mL (ref <0.30)

## 2013-06-08 LAB — DIFFERENTIAL
Basophils Relative: 0 % (ref 0–1)
Eosinophils Absolute: 0.1 10*3/uL (ref 0.0–0.7)
Eosinophils Relative: 1 % (ref 0–5)
Monocytes Relative: 7 % (ref 3–12)
Neutro Abs: 5 10*3/uL (ref 1.7–7.7)
Neutrophils Relative %: 66 % (ref 43–77)

## 2013-06-08 LAB — CBC
MCH: 30.9 pg (ref 26.0–34.0)
MCHC: 34.8 g/dL (ref 30.0–36.0)
Platelets: 315 10*3/uL (ref 150–400)
RBC: 5.14 MIL/uL (ref 4.22–5.81)
WBC: 7.5 10*3/uL (ref 4.0–10.5)

## 2013-06-08 LAB — POCT I-STAT TROPONIN I: Troponin i, poc: 0 ng/mL (ref 0.00–0.08)

## 2013-06-08 LAB — POCT I-STAT, CHEM 8
Calcium, Ion: 1.12 mmol/L (ref 1.12–1.23)
Chloride: 105 mEq/L (ref 96–112)
Glucose, Bld: 100 mg/dL — ABNORMAL HIGH (ref 70–99)
HCT: 47 % (ref 39.0–52.0)
Hemoglobin: 16 g/dL (ref 13.0–17.0)
Sodium: 140 mEq/L (ref 135–145)
TCO2: 25 mmol/L (ref 0–100)

## 2013-06-08 LAB — PROTIME-INR
INR: 1.07 (ref 0.00–1.49)
Prothrombin Time: 13.7 seconds (ref 11.6–15.2)

## 2013-06-08 LAB — APTT: aPTT: 31 seconds (ref 24–37)

## 2013-06-08 LAB — GLUCOSE, CAPILLARY: Glucose-Capillary: 92 mg/dL (ref 70–99)

## 2013-06-08 MED ORDER — ATORVASTATIN CALCIUM 40 MG PO TABS
40.0000 mg | ORAL_TABLET | Freq: Every day | ORAL | Status: DC
  Administered 2013-06-08 – 2013-06-09 (×2): 40 mg via ORAL
  Filled 2013-06-08 (×5): qty 1

## 2013-06-08 MED ORDER — PANTOPRAZOLE SODIUM 40 MG PO TBEC
80.0000 mg | DELAYED_RELEASE_TABLET | Freq: Every day | ORAL | Status: DC
  Administered 2013-06-09 (×2): 80 mg via ORAL
  Filled 2013-06-08 (×3): qty 2

## 2013-06-08 MED ORDER — FENTANYL CITRATE 0.05 MG/ML IJ SOLN
50.0000 ug | Freq: Once | INTRAMUSCULAR | Status: AC
  Administered 2013-06-08: 50 ug via INTRAVENOUS
  Filled 2013-06-08: qty 2

## 2013-06-08 MED ORDER — PANTOPRAZOLE SODIUM 40 MG PO TBEC: 40.0000 mg | DELAYED_RELEASE_TABLET | Freq: Every day | ORAL | Status: DC

## 2013-06-08 MED ORDER — VARENICLINE TARTRATE 0.5 MG PO TABS
0.5000 mg | ORAL_TABLET | Freq: Two times a day (BID) | ORAL | Status: DC
  Administered 2013-06-08 – 2013-06-09 (×3): 0.5 mg via ORAL
  Filled 2013-06-08 (×9): qty 1

## 2013-06-08 MED ORDER — LORAZEPAM 2 MG/ML IJ SOLN: 1.0000 mg | Freq: Once | INTRAMUSCULAR | Status: AC | PRN

## 2013-06-08 MED ORDER — PROMETHAZINE HCL 25 MG/ML IJ SOLN
12.5000 mg | Freq: Four times a day (QID) | INTRAMUSCULAR | Status: DC | PRN
  Administered 2013-06-08: 25 mg via INTRAVENOUS
  Filled 2013-06-08: qty 1

## 2013-06-08 MED ORDER — VERAPAMIL HCL 40 MG PO TABS
40.0000 mg | ORAL_TABLET | Freq: Two times a day (BID) | ORAL | Status: DC
  Administered 2013-06-08 – 2013-06-09 (×3): 40 mg via ORAL
  Filled 2013-06-08 (×9): qty 1

## 2013-06-08 MED ORDER — ASPIRIN EC 81 MG PO TBEC
81.0000 mg | DELAYED_RELEASE_TABLET | Freq: Every day | ORAL | Status: DC
  Administered 2013-06-09: 81 mg via ORAL
  Filled 2013-06-08 (×4): qty 1

## 2013-06-08 MED ORDER — ONDANSETRON HCL 4 MG PO TABS
4.0000 mg | ORAL_TABLET | Freq: Three times a day (TID) | ORAL | Status: DC | PRN
  Administered 2013-06-08 – 2013-06-12 (×2): 4 mg via ORAL
  Filled 2013-06-08 (×2): qty 1

## 2013-06-08 MED ORDER — LISINOPRIL 20 MG PO TABS
20.0000 mg | ORAL_TABLET | Freq: Every day | ORAL | Status: DC
  Administered 2013-06-08 – 2013-06-09 (×2): 20 mg via ORAL
  Filled 2013-06-08 (×5): qty 1

## 2013-06-08 MED ORDER — HEPARIN SODIUM (PORCINE) 5000 UNIT/ML IJ SOLN
5000.0000 [IU] | Freq: Three times a day (TID) | INTRAMUSCULAR | Status: DC
  Administered 2013-06-08 – 2013-06-12 (×10): 5000 [IU] via SUBCUTANEOUS
  Filled 2013-06-08 (×14): qty 1

## 2013-06-08 MED ORDER — HYDROMORPHONE HCL PF 1 MG/ML IJ SOLN
1.0000 mg | INTRAMUSCULAR | Status: DC | PRN
  Administered 2013-06-08 (×2): 1 mg via INTRAVENOUS
  Filled 2013-06-08: qty 2
  Filled 2013-06-08: qty 1

## 2013-06-08 NOTE — ED Notes (Signed)
Report given to floor, [place], Charity fundraiser. Nurse has no further questions upon report given pt being prepared for transport to floor.

## 2013-06-08 NOTE — ED Notes (Signed)
Pt here by [employer] for code stroke, pt  Was at work at [employer] and began having headache and weakness to left side, pt with hx of TIA in past, pt drove self to fire dept and then called 911 and transported here for onset of left side facial droop, headache and left weakness since 0815.

## 2013-06-08 NOTE — Code Documentation (Signed)
52 yo male who was working at [facility] [employer] [employer] today when he had sudden onset of ha and L side weakness at 0730.  Shortly after that, he noticed is L eye bleeding, eventually saturating 2 paper towels. He decided to call his doctor's office who suggested he call 9-1-1. EMS activated code stroke at 1024 and pt arrived at 83.  He was cleared at the bridge and taken to CT which showed no acute abnormality. Neurologist arrived at 1045.  NIHSS is 4: two for LUE, one for LLE, and one for L sensory deficit. The pt splits the midline on the sensory exam, LUE drops to bed but when held over face he redirects it w/o difficulty, and he pushes down with his LLE when examiner attempts to lift LLE. There is no evidence of any blood in the L eye, but EMS confirms seeing a bloody paper towel over the OS.  BP was elevated in the field, 190/110, and down slightly here: 180s/90s.  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  He is currently on medication for his BP as well as daily ASA and Lipitor. Neurologist recommends admit TIA work-up. Will need to check with wife when she arrives as it sounds like he has recent work-up at [facility]. At 1130, pt can now lift LUE w/o difficulty and says he is much better. Hand off done with ED RN.  Will need q 2 hr VS/neuro checks.

## 2013-06-08 NOTE — ED Provider Notes (Signed)
CSN: [id number]     Arrival date & time 06/08/13  1032 History   First MD Initiated Contact with Patient 06/08/13 1043     Chief Complaint  Patient presents with  . Code Stroke    HPI Pt here by [employer] for code stroke, pt Was at work at [employer] and began having headache and weakness to left side, pt with hx of TIA in past, pt drove self to fire dept and then called 911 and transported here for onset of left side facial droop, headache and left weakness since 0815.  Past Medical History  Diagnosis Date  . TIA (transient ischemic attack)   . Hypertension   . Hyperlipidemia    Past Surgical History  Procedure Laterality Date  . Abdominal aortic aneurysm repair    . Cholecystectomy    . Appendectomy     History reviewed. No pertinent family history. History  Substance Use Topics  . Smoking status: Current Every Day Smoker -- 0.50 packs/day    Types: Cigarettes  . Smokeless tobacco: Not on file  . Alcohol Use: No    Review of Systems Level V caveat Allergies  Penicillins and Toradol  Home Medications   No current outpatient prescriptions on file. BP 146/101  Pulse 60  Temp(Src) 97.5 F (36.4 C) (Oral)  Resp 18  Ht 5\' 11"  (1.803 m)  Wt 205 lb (92.987 kg)  BMI 28.6 kg/m2  SpO2 94% Physical Exam  Nursing note and vitals reviewed. Constitutional: He is oriented to person, place, and time. He appears well-developed and well-nourished. No distress.  HENT:  Head: Normocephalic and atraumatic.  Eyes: Pupils are equal, round, and reactive to light.  Neck: Normal range of motion.  Cardiovascular: Normal rate and intact distal pulses.   Pulmonary/Chest: No respiratory distress.  Abdominal: Normal appearance. He exhibits no distension.  Musculoskeletal: Normal range of motion.  Neurological: He is alert and oriented to person, place, and time. No cranial nerve deficit. GCS eye subscore is 4. GCS verbal subscore is 5. GCS motor subscore is 6.  Some initial  left-sided ME weakness that resolved fairly quickly.  Skin: Skin is warm and dry. No rash noted.  Psychiatric: He has a normal mood and affect. His behavior is normal.    ED Course  Procedures (including critical care time)  Date: 06/08/2013  Rate: 74  Rhythm: normal sinus rhythm  QRS Axis: normal  Intervals: normal  ST/T Wave abnormalities: normal  Conduction Disutrbances: none  Narrative Interpretation: unremarkable  CRITICAL CARE Performed by: Refugia Laneve L Total critical care time: 30 min Critical care time was exclusive of separately billable procedures and treating other patients. Critical care was necessary to treat or prevent imminent or life-threatening deterioration. Critical care was time spent personally by me on the following activities: development of treatment plan with patient and/or surrogate as well as nursing, discussions with consultants, evaluation of patient's response to treatment, examination of patient, obtaining history from patient or surrogate, ordering and performing treatments and interventions, ordering and review of laboratory studies, ordering and review of radiographic studies, pulse oximetry and re-evaluation of patient's condition.   Patient seen and evaluated in the emergency department by neurology.  They recommended admission to internal medicine the diagnosis of TIA. Labs Review Labs Reviewed  COMPREHENSIVE METABOLIC PANEL - Abnormal; Notable for the following:    Glucose, Bld 100 (*)    Alkaline Phosphatase 139 (*)    All other components within normal limits  POCT I-STAT, CHEM 8 -  Abnormal; Notable for the following:    Glucose, Bld 100 (*)    All other components within normal limits  ETHANOL  PROTIME-INR  APTT  CBC  DIFFERENTIAL  TROPONIN I  GLUCOSE, CAPILLARY  HEMOGLOBIN A1C  LIPID PANEL  POCT I-STAT TROPONIN I   Imaging Review Dg Chest 2 View  06/08/2013   CLINICAL DATA:  Stroke. Hypertension. Hyperlipidemia.  EXAM: CHEST   2 VIEW  COMPARISON:  None.  FINDINGS: Lateral view degraded by patient arm position. Prior median sternotomy. Patient rotated to the left on the frontal film. Cardiomegaly. Mildly low lung volumes. No pleural effusion or pneumothorax. Clear lungs.  IMPRESSION: Cardiomegaly and low lung volumes, without acute disease.   Electronically Signed   By: Jeronimo Greaves   On: 06/08/2013 20:02   Ct Head Wo Contrast  06/08/2013   CLINICAL DATA:  52 year old male code stroke. Sudden onset left facial droop and left side weakness. Headache.  EXAM: CT HEAD WITHOUT CONTRAST  TECHNIQUE: Contiguous axial images were obtained from the base of the skull through the vertex without intravenous contrast.  COMPARISON:  None.  FINDINGS: Visualized paranasal sinuses and mastoids are clear. No acute osseous abnormality identified. Visualized orbits and scalp soft tissues are within normal limits.  Normal cerebral volume. No ventriculomegaly. No midline shift, mass effect, or evidence of intracranial mass lesion. No acute intracranial hemorrhage identified.  There is bilateral mostly subcortical scattered white matter hypodensity, nonspecific. No evidence of cortically based acute infarction identified. No suspicious intracranial vascular hyperdensity; the MCA M1 segments appear symmetric.  IMPRESSION: No evidence of cortically based acute infarct. No acute intracranial hemorrhage identified. Nonspecific white matter changes.  Study discussed by telephone with Dr. Noel Christmas on 06/08/2013 at 10:55 .   Electronically Signed   By: Augusto Gamble M.D.   On: 06/08/2013 10:58    MDM   1. TIA (transient ischemic attack)   2. Left-sided weakness   3. Blurry vision   4. HTN (hypertension)   5. Left sided numbness        Nelia Shi, MD 06/08/13 2022

## 2013-06-08 NOTE — ED Notes (Signed)
Dr. Radford Pax made aware of pt requesting something for pain for his HA. No further orders received at this time.

## 2013-06-08 NOTE — ED Notes (Signed)
Phlebotomy at the bedside  

## 2013-06-08 NOTE — H&P (Signed)
[facility] History and Physical  Bradley Prince ZOX:[id number] DOB: September 01, 1961 DOA: 06/08/2013  Referring physician: Radford Pax PCP: Marcell Anger, NP   Chief Complaint: Left-sided weakness  HPI: Bradley Prince is a 52 y.o. male with basilar history of hypertension and hyperlipidemia. Patient came in to the hospital because of acute left-sided weakness. Patient was off of work since June for the same above complaints. He works at [employer]. About 7:30 today he developed left-sided weakness, numbness and severe headache. Headache is around his left eye, patient also mentioned that his left eye was bleeding with some blurring of vision as well. Denies any slurred speech or facial asymmetry. In the ED patient evaluated by neurology and they recommended workup for TIA.  Review of Systems:  Constitutional: negative for anorexia, fevers and sweats Eyes: negative for irritation, redness and visual disturbance Ears, nose, mouth, throat, and face: negative for earaches, epistaxis, nasal congestion and sore throat Respiratory: negative for cough, dyspnea on exertion, sputum and wheezing Cardiovascular: negative for chest pain, dyspnea, lower extremity edema, orthopnea, palpitations and syncope Gastrointestinal: negative for abdominal pain, constipation, diarrhea, melena, nausea and vomiting Genitourinary:negative for dysuria, frequency and hematuria Hematologic/lymphatic: negative for bleeding, easy bruising and lymphadenopathy Musculoskeletal:negative for arthralgias, muscle weakness and stiff joints Neurological: Per HPI  Endocrine: negative for diabetic symptoms including polydipsia, polyuria and weight loss Allergic/Immunologic: negative for anaphylaxis, hay fever and urticaria   Past Medical History  Diagnosis Date  . TIA (transient ischemic attack)   . Hypertension   . Hyperlipidemia    Past Surgical History  Procedure Laterality Date  . Abdominal aortic  aneurysm repair    . Cholecystectomy    . Appendectomy     Social History:  reports that he has been smoking Cigarettes.  He has been smoking about 0.50 packs per day. He does not have any smokeless tobacco history on file. He reports that he does not drink alcohol or use illicit drugs.  Allergies  Allergen Reactions  . Penicillins Itching  . Toradol [Ketorolac Tromethamine] Swelling    Makes my throat swell     History reviewed. No pertinent family history.   Prior to Admission medications   Medication Sig Start Date End Date Taking? Authorizing Provider  aspirin EC 81 MG tablet Take 81 mg by mouth daily.   Yes Historical Provider, MD  atorvastatin (LIPITOR) 40 MG tablet Take 40 mg by mouth daily.   Yes Historical Provider, MD  indomethacin (INDOCIN) 25 MG capsule Take 25 mg by mouth daily as needed (for headaches).   Yes Historical Provider, MD  lisinopril (PRINIVIL,ZESTRIL) 20 MG tablet Take 20 mg by mouth daily.   Yes Historical Provider, MD  omeprazole (PRILOSEC) 40 MG capsule Take 40 mg by mouth daily as needed (for acid reflux).    Yes Historical Provider, MD  ondansetron (ZOFRAN) 4 MG tablet Take 4 mg by mouth every 8 (eight) hours as needed for nausea.   Yes Historical Provider, MD  varenicline (CHANTIX) 0.5 MG tablet Take 0.5 mg by mouth 2 (two) times daily.   Yes Historical Provider, MD  verapamil (CALAN) 40 MG tablet Take 40 mg by mouth 2 (two) times daily.   Yes Historical Provider, MD   Physical Exam: Filed Vitals:   06/08/13 1800  BP: 146/101  Pulse: 60  Temp: 97.5 F (36.4 C)  Resp: 18   General appearance: alert, cooperative and no distress  Head: Normocephalic, without obvious abnormality, atraumatic  Eyes: conjunctivae/corneas clear. PERRL, EOM's intact.  Fundi benign.  Nose: Nares normal. Septum midline. Mucosa normal. No drainage or sinus tenderness.  Throat: lips, mucosa, and tongue normal; teeth and gums normal  Neck: Supple, no masses, no cervical  lymphadenopathy, no JVD appreciated, no meningeal signs Resp: clear to auscultation bilaterally  Chest wall: no tenderness  Cardio: regular rate and rhythm, S1, S2 normal, no murmur, click, rub or gallop  GI: soft, non-tender; bowel sounds normal; no masses, no organomegaly  Extremities: extremities normal, atraumatic, no cyanosis or edema  Skin: Skin color, texture, turgor normal. No rashes or lesions  Neurologic: Alert and oriented X 3, normal strength and tone. Normal symmetric reflexes. Normal coordination and gait   Labs on Admission:  Basic Metabolic Panel:  Recent Labs Lab 06/08/13 1100 06/08/13 1116  NA 136 140  K 3.9 4.0  CL 101 105  CO2 26  --   GLUCOSE 100* 100*  BUN 9 8  CREATININE 0.87 0.90  CALCIUM 9.6  --    Liver Function Tests:  Recent Labs Lab 06/08/13 1100  AST 18  ALT 20  ALKPHOS 139*  BILITOT 0.8  PROT 7.4  ALBUMIN 4.0   No results found for this basename: LIPASE, AMYLASE,  in the last 168 hours No results found for this basename: AMMONIA,  in the last 168 hours CBC:  Recent Labs Lab 06/08/13 1100 06/08/13 1116  WBC 7.5  --   NEUTROABS 5.0  --   HGB 15.9 16.0  HCT 45.7 47.0  MCV 88.9  --   PLT 315  --    Cardiac Enzymes:  Recent Labs Lab 06/08/13 1100  TROPONINI <0.30    BNP (last 3 results) No results found for this basename: PROBNP,  in the last 8760 hours CBG:  Recent Labs Lab 06/08/13 1052  GLUCAP 92    Radiological Exams on Admission: Ct Head Wo Contrast  06/08/2013   CLINICAL DATA:  52 year old male code stroke. Sudden onset left facial droop and left side weakness. Headache.  EXAM: CT HEAD WITHOUT CONTRAST  TECHNIQUE: Contiguous axial images were obtained from the base of the skull through the vertex without intravenous contrast.  COMPARISON:  None.  FINDINGS: Visualized paranasal sinuses and mastoids are clear. No acute osseous abnormality identified. Visualized orbits and scalp soft tissues are within normal  limits.  Normal cerebral volume. No ventriculomegaly. No midline shift, mass effect, or evidence of intracranial mass lesion. No acute intracranial hemorrhage identified.  There is bilateral mostly subcortical scattered white matter hypodensity, nonspecific. No evidence of cortically based acute infarction identified. No suspicious intracranial vascular hyperdensity; the MCA M1 segments appear symmetric.  IMPRESSION: No evidence of cortically based acute infarct. No acute intracranial hemorrhage identified. Nonspecific white matter changes.  Study discussed by telephone with Dr. Noel Christmas on 06/08/2013 at 10:55 .   Electronically Signed   By: Augusto Gamble M.D.   On: 06/08/2013 10:58    EKG: Independently reviewed.   Assessment/Plan Principal Problem:   Left-sided weakness Active Problems:   Severe headache   Left sided numbness   Blurry vision   HTN (hypertension)   Left-sided weakness -Patient presented with a left-sided weakness, severe headache. -Seen by neurology recommended to rule out TIA. Patient weakness resolved and almost back to baseline in the ED. -Obtain MRI/ MRA, rest of TIA workup. -Obtain MR of the C-spine to rule out radiculopathy.  Severe headache -Unclear etiology, patient said he is being treated for "white matter" disease at [facility]. -MRI of the brain/C-spine  to be done. -Control pain with narcotic pain medications.  Hypertension -Continue preadmission oral medications.  Code Status: Full code Family Communication: Plan discussed with the patient in the presence of his wife at bedside. Disposition Plan: Observation  Time spent: 70 minutes  [facility] Pager [phone]  If 7PM-7AM, please contact night-coverage [email or web address] Password [email or web address] 06/08/2013, 7:13 PM

## 2013-06-08 NOTE — Progress Notes (Signed)
Referring Physician: Dr. Radford Pax    Chief Complaint: Acute onset of left-sided weakness and numbness with associated headache.  HPI: Bradley Prince is an 52 y.o. male with a history of hypertension as well as recurrent episodes of left-sided numbness with associated headache, presenting with recurrent headache with left-sided numbness as well as weakness. His last known well at 7:30 AM today. CT scan of his head showed no acute intracranial abnormality. Patient had no clear speech change. He is planning of blurring of vision involving his left eye but no other visual changes. NIH stroke score was 3.  LSN: 7:30 AM on 06/08/2013 tPA Given: No: Mild deficits only with psychogenic factors. MRankin: 0   No past medical history on file.  No family history on file.   Medications: I have reviewed the patient's current medications.   ROS: History obtained from the patient  General ROS: negative for - chills, fatigue, fever, night sweats, weight gain or weight loss Psychological ROS: negative for - behavioral disorder, hallucinations, memory difficulties, mood swings or suicidal ideation Ophthalmic ROS: negative for - blurry vision, double vision, eye pain or loss of vision ENT ROS: negative for - epistaxis, nasal discharge, oral lesions, sore throat, tinnitus or vertigo Allergy and Immunology ROS: negative for - hives or itchy/watery eyes Hematological and Lymphatic ROS: negative for - bleeding problems, bruising or swollen lymph nodes Endocrine ROS: negative for - galactorrhea, hair pattern changes, polydipsia/polyuria or temperature intolerance Respiratory ROS: negative for - cough, hemoptysis, shortness of breath or wheezing Cardiovascular ROS: negative for - chest pain, dyspnea on exertion, edema or irregular heartbeat Gastrointestinal ROS: negative for - abdominal pain, diarrhea, hematemesis, nausea/vomiting or stool incontinence Genito-Urinary ROS: negative for - dysuria, hematuria,  incontinence or urinary frequency/urgency Musculoskeletal ROS: negative for - joint swelling or muscular weakness Neurological ROS: as noted in HPI Dermatological ROS: negative for rash and skin lesion changes  Physical Examination: Blood pressure 179/93, temperature 98.4 F (36.9 C), temperature source Oral, resp. rate 17, height 5\' 11"  (1.803 m), weight 92.987 kg (205 lb), SpO2 99.00%.  Neurologic Examination: Mental Status: Alert, oriented, thought content appropriate.  Speech fluent without evidence of aphasia. Able to follow commands. Effort was poor with manual testing of left extremities. Cranial Nerves: II-Visual fields were normal. III/IV/VI-Pupils were equal and reacted. Extraocular movements were full and conjugate.    V/VII-slight numbness of left face; no facial weakness. VIII-normal. X-normal speech. Motor: Drift of left upper and lower extremities against gravity with poor voluntary effort; motor exam is otherwise normal. Sensory: Reduced perception of tactile sensation over left extremities compared to right extremities. Deep Tendon Reflexes: 1+ and symmetric. Plantars: Flexor bilaterally Cerebellar: Normal finger-to-nose testing.  Ct Head Wo Contrast  06/08/2013   CLINICAL DATA:  52 year old male code stroke. Sudden onset left facial droop and left side weakness. Headache.  EXAM: CT HEAD WITHOUT CONTRAST  TECHNIQUE: Contiguous axial images were obtained from the base of the skull through the vertex without intravenous contrast.  COMPARISON:  None.  FINDINGS: Visualized paranasal sinuses and mastoids are clear. No acute osseous abnormality identified. Visualized orbits and scalp soft tissues are within normal limits.  Normal cerebral volume. No ventriculomegaly. No midline shift, mass effect, or evidence of intracranial mass lesion. No acute intracranial hemorrhage identified.  There is bilateral mostly subcortical scattered white matter hypodensity, nonspecific. No evidence  of cortically based acute infarction identified. No suspicious intracranial vascular hyperdensity; the MCA M1 segments appear symmetric.  IMPRESSION: No evidence of cortically based acute infarct.  No acute intracranial hemorrhage identified. Nonspecific white matter changes.  Study discussed by telephone with Dr. Noel Christmas on 06/08/2013 at 10:55 .   Electronically Signed   By: Augusto Gamble M.D.   On: 06/08/2013 10:58    Assessment: 52 y.o. male presenting with possible TIA with subcortical focal ischemia involving the right MCA territory. T  Stroke Risk Factors - hypertension  Plan: 1. HgbA1c, fasting lipid panel 2. MRI, MRA  of the brain without contrast 3. PT consult, OT consult, Speech consult 4. Echocardiogram 5. Carotid dopplers 6. Prophylactic therapy-Antiplatelet med: Aspirin 81 mg per day 7. Risk factor modification 8. Telemetry monitoring   C.R. Roseanne Reno, MD [facility] [phone]  06/08/2013, 11:10 AM

## 2013-06-08 NOTE — ED Notes (Signed)
MD at bedside. Dr. Roseanne Reno with neurology

## 2013-06-08 NOTE — ED Notes (Signed)
Pt did have bloody bandage removed from left eye with ems, no bleeding on arrival here

## 2013-06-09 ENCOUNTER — Observation Stay (HOSPITAL_COMMUNITY): Payer: BC Managed Care – PPO

## 2013-06-09 DIAGNOSIS — R110 Nausea: Secondary | ICD-10-CM | POA: Diagnosis not present

## 2013-06-09 DIAGNOSIS — R51 Headache: Secondary | ICD-10-CM

## 2013-06-09 DIAGNOSIS — G459 Transient cerebral ischemic attack, unspecified: Secondary | ICD-10-CM

## 2013-06-09 DIAGNOSIS — I517 Cardiomegaly: Secondary | ICD-10-CM

## 2013-06-09 DIAGNOSIS — F17200 Nicotine dependence, unspecified, uncomplicated: Secondary | ICD-10-CM | POA: Diagnosis present

## 2013-06-09 DIAGNOSIS — E785 Hyperlipidemia, unspecified: Secondary | ICD-10-CM | POA: Diagnosis present

## 2013-06-09 LAB — HEMOGLOBIN A1C
Hgb A1c MFr Bld: 5.9 % — ABNORMAL HIGH (ref <5.7)
Mean Plasma Glucose: 123 mg/dL — ABNORMAL HIGH (ref <117)

## 2013-06-09 LAB — LIPID PANEL
LDL Cholesterol: 130 mg/dL — ABNORMAL HIGH (ref 0–99)
Triglycerides: 218 mg/dL — ABNORMAL HIGH (ref <150)
VLDL: 44 mg/dL — ABNORMAL HIGH (ref 0–40)

## 2013-06-09 MED ORDER — MIDAZOLAM HCL 2 MG/2ML IJ SOLN
INTRAMUSCULAR | Status: AC
  Filled 2013-06-09: qty 2

## 2013-06-09 MED ORDER — INFLUENZA VAC SPLIT QUAD 0.5 ML IM SUSP
0.5000 mL | INTRAMUSCULAR | Status: AC
  Filled 2013-06-09: qty 0.5

## 2013-06-09 MED ORDER — FENTANYL CITRATE 0.05 MG/ML IJ SOLN
50.0000 ug | Freq: Once | INTRAMUSCULAR | Status: AC
  Administered 2013-06-09: 50 ug via INTRAVENOUS
  Filled 2013-06-09: qty 2

## 2013-06-09 MED ORDER — DIPHENHYDRAMINE HCL 25 MG PO CAPS
25.0000 mg | ORAL_CAPSULE | Freq: Three times a day (TID) | ORAL | Status: DC | PRN
  Administered 2013-06-09 – 2013-06-10 (×2): 25 mg via ORAL
  Filled 2013-06-09 (×2): qty 1

## 2013-06-09 MED ORDER — ACETAMINOPHEN 325 MG PO TABS
650.0000 mg | ORAL_TABLET | Freq: Four times a day (QID) | ORAL | Status: DC | PRN
  Administered 2013-06-09: 650 mg via ORAL
  Filled 2013-06-09: qty 2

## 2013-06-09 MED ORDER — LORAZEPAM 2 MG/ML IJ SOLN
1.0000 mg | Freq: Four times a day (QID) | INTRAMUSCULAR | Status: DC | PRN
  Administered 2013-06-09 – 2013-06-12 (×5): 1 mg via INTRAVENOUS
  Filled 2013-06-09 (×6): qty 1

## 2013-06-09 MED ORDER — HYDROMORPHONE HCL PF 1 MG/ML IJ SOLN
0.5000 mg | INTRAMUSCULAR | Status: DC | PRN
  Administered 2013-06-10 (×2): 0.5 mg via INTRAVENOUS
  Filled 2013-06-09 (×2): qty 1

## 2013-06-09 MED ORDER — HYDROMORPHONE HCL PF 1 MG/ML IJ SOLN: 1.0000 mg | INTRAMUSCULAR | Status: DC | PRN

## 2013-06-09 MED ORDER — LORAZEPAM 2 MG/ML IJ SOLN
1.0000 mg | Freq: Once | INTRAMUSCULAR | Status: AC
  Administered 2013-06-09: 1 mg via INTRAVENOUS

## 2013-06-09 NOTE — Progress Notes (Signed)
Bilateral carotid artery duplex:  1-39% ICA stenosis.  Vertebral artery flow is antegrade.     

## 2013-06-09 NOTE — Progress Notes (Signed)
[facility] PROGRESS NOTE  Bradley Prince ZOX:[id number] DOB: 1960/09/19 DOA: 06/08/2013 PCP: Marcell Anger, NP  Assessment/Plan: Left-sided weakness  -concerning for recurrent TIA vs new CVA  -Patient presented with a left-sided weakness, severe headache.  -Seen by neurology recommended to rule out TIA. Patient weakness improving but not back to baseline. -Obtain MRI/ MRA, carotids, ECHO  -Obtain MR of the C-spine to rule out radiculopathy.   Severe headache  -Unclear etiology, patient said he is being treated for "white matter" disease at [facility].  -MRI of the brain/C-spine to be done.  -Control pain with medications as needed.  Nausea -likely this is from pain meds.   Hypertension  -Continue preadmission oral medications.   Dyslipidemia  -Pt says he takes his lipitor daily but not sure of home dose. -now on lipitor 40 mg daily  Tobacco User The patient was counseled on the dangers of tobacco use, and was advised to quit.  Reviewed strategies to maximize success, including removing cigarettes and smoking materials from environment, stress management and support of family/friends.  Code Status: Full code  Family Communication: Plan discussed with the patient in the presence of his wife at bedside.  Disposition Plan: Observation   HPI/Subjective: Pt says that he is nauseated and woke up with a headache this morning.   Objective: Filed Vitals:   06/09/13 0630  BP: 125/71  Pulse: 64  Temp: 98.4 F (36.9 C)  Resp: 18    Intake/Output Summary (Last 24 hours) at 06/09/13 0806 Last data filed at 06/08/13 1700  Gross per 24 hour  Intake    240 ml  Output      0 ml  Net    240 ml   Filed Weights   06/08/13 1100  Weight: 92.987 kg (205 lb)    Exam:  General appearance: alert, cooperative and no distress  Head: Normocephalic, without obvious abnormality, atraumatic  Eyes: conjunctivae/corneas clear. PERRL, EOM's intact. Fundi benign.  Nose: Nares  normal. Septum midline. Mucosa normal. No drainage or sinus tenderness.  Throat: lips, mucosa, and tongue normal; teeth and gums normal  Neck: Supple, no masses, no cervical lymphadenopathy, no JVD appreciated, no meningeal signs  Resp: clear to auscultation bilaterally  Chest wall: no tenderness  Cardio: regular rate and rhythm, S1, S2 normal, no murmur, click, rub or gallop  GI: soft, non-tender; bowel sounds normal; no masses, no organomegaly  Extremities: extremities normal, atraumatic, no cyanosis or edema  Skin: Skin color, texture, turgor normal. No rashes or lesions  Neurologic: Alert and oriented X 3, slightly less power in LUE/LLE but 4/5.  RUE/RLE 5/5   Data Reviewed: Basic Metabolic Panel:  Recent Labs Lab 06/08/13 1100 06/08/13 1116  NA 136 140  K 3.9 4.0  CL 101 105  CO2 26  --   GLUCOSE 100* 100*  BUN 9 8  CREATININE 0.87 0.90  CALCIUM 9.6  --    Liver Function Tests:  Recent Labs Lab 06/08/13 1100  AST 18  ALT 20  ALKPHOS 139*  BILITOT 0.8  PROT 7.4  ALBUMIN 4.0   No results found for this basename: LIPASE, AMYLASE,  in the last 168 hours No results found for this basename: AMMONIA,  in the last 168 hours CBC:  Recent Labs Lab 06/08/13 1100 06/08/13 1116  WBC 7.5  --   NEUTROABS 5.0  --   HGB 15.9 16.0  HCT 45.7 47.0  MCV 88.9  --   PLT 315  --  Cardiac Enzymes:  Recent Labs Lab 06/08/13 1100  TROPONINI <0.30   BNP (last 3 results) No results found for this basename: PROBNP,  in the last 8760 hours CBG:  Recent Labs Lab 06/08/13 1052  GLUCAP 92    No results found for this or any previous visit (from the past 240 hour(s)).   Studies: Dg Chest 2 View  06/08/2013   CLINICAL DATA:  Stroke. Hypertension. Hyperlipidemia.  EXAM: CHEST  2 VIEW  COMPARISON:  None.  FINDINGS: Lateral view degraded by patient arm position. Prior median sternotomy. Patient rotated to the left on the frontal film. Cardiomegaly. Mildly low lung volumes.  No pleural effusion or pneumothorax. Clear lungs.  IMPRESSION: Cardiomegaly and low lung volumes, without acute disease.   Electronically Signed   By: Jeronimo Greaves   On: 06/08/2013 20:02   Ct Head Wo Contrast  06/08/2013   CLINICAL DATA:  52 year old male code stroke. Sudden onset left facial droop and left side weakness. Headache.  EXAM: CT HEAD WITHOUT CONTRAST  TECHNIQUE: Contiguous axial images were obtained from the base of the skull through the vertex without intravenous contrast.  COMPARISON:  None.  FINDINGS: Visualized paranasal sinuses and mastoids are clear. No acute osseous abnormality identified. Visualized orbits and scalp soft tissues are within normal limits.  Normal cerebral volume. No ventriculomegaly. No midline shift, mass effect, or evidence of intracranial mass lesion. No acute intracranial hemorrhage identified.  There is bilateral mostly subcortical scattered white matter hypodensity, nonspecific. No evidence of cortically based acute infarction identified. No suspicious intracranial vascular hyperdensity; the MCA M1 segments appear symmetric.  IMPRESSION: No evidence of cortically based acute infarct. No acute intracranial hemorrhage identified. Nonspecific white matter changes.  Study discussed by telephone with Dr. Noel Christmas on 06/08/2013 at 10:55 .   Electronically Signed   By: Augusto Gamble M.D.   On: 06/08/2013 10:58    Scheduled Meds: . aspirin EC  81 mg Oral Daily  . atorvastatin  40 mg Oral Daily  . heparin  5,000 Units Subcutaneous Q8H  . lisinopril  20 mg Oral Daily  . pantoprazole  80 mg Oral Daily  . varenicline  0.5 mg Oral BID  . verapamil  40 mg Oral BID   Continuous Infusions:   Principal Problem:   Left-sided weakness Active Problems:   Severe headache   Left sided numbness   Blurry vision   HTN (hypertension)   [employer] [phone]. If 7PM-7AM, please contact night-coverage at [email or web address], password  [facility] 06/09/2013, 8:06 AM  LOS: 1 day

## 2013-06-09 NOTE — Progress Notes (Signed)
Echocardiogram 2D Echocardiogram has been performed.  Bradley Prince 06/09/2013, 12:31 PM

## 2013-06-09 NOTE — Progress Notes (Signed)
SLP Cancellation Note  Patient Details Name: Bradley Prince MRN: [id number] DOB: [birth year]   Cancelled treatment:        Had planned to complete MBS today at 1330.  Pt. Was still in MRI, and had been sedated with ativan prior to MRI.  Defer MBS until 9/24.  Recommend diet be downgraded to Dysphagia 3 with chopped meats.  Will change liquids to Nectar until MBS completed.   Maryjo Rochester T 06/09/2013, 3:57 PM

## 2013-06-09 NOTE — Progress Notes (Signed)
UR complete.  Malayiah Mcbrayer RN, MSN 

## 2013-06-09 NOTE — Progress Notes (Signed)
Stroke Team Progress Note  HISTORY Bradley Prince is an 52 y.o. male with a history of hypertension as well as recurrent episodes of left-sided numbness with associated headache, presenting with recurrent headache with left-sided numbness as well as weakness. His last known well at 7:30 AM 06/08/2013. CT scan of his head showed no acute intracranial abnormality. Patient had no clear speech change. He is complaining of blurring of vision involving his left eye but no other visual changes. NIH stroke score was 3.   LSN: 7:30 AM on 06/08/2013  tPA Given: No: Mild deficits only with psychogenic factors.  MRankin: 0   SUBJECTIVE There were no family members present this morning. The patient reports that he is improving although he still has weakness and numbness in the left upper and lower extremities. He reports that yesterday when this episode occurred he was having bleeding from his left eye. There is no evidence of blood today.  OBJECTIVE Most recent Vital Signs: Filed Vitals:   06/09/13 0230 06/09/13 0430 06/09/13 0630 06/09/13 1002  BP: 119/76 108/63 125/71 116/61  Pulse: 58 57 64 64  Temp: 97.6 F (36.4 C) 97.8 F (36.6 C) 98.4 F (36.9 C) 98.6 F (37 C)  TempSrc: Oral Oral Oral Oral  Resp: 18 16 18 18   Height:      Weight:      SpO2: 97% 97% 99% 94%   CBG (last 3)   Recent Labs  06/08/13 1052  GLUCAP 92    IV Fluid Intake:     MEDICATIONS  . aspirin EC  81 mg Oral Daily  . atorvastatin  40 mg Oral Daily  . heparin  5,000 Units Subcutaneous Q8H  . lisinopril  20 mg Oral Daily  . pantoprazole  80 mg Oral Daily  . varenicline  0.5 mg Oral BID  . verapamil  40 mg Oral BID   PRN:  acetaminophen, HYDROmorphone (DILAUDID) injection, ondansetron, promethazine  Diet:  Cardiac thin liquids Activity:  Up with assistance DVT Prophylaxis:  Subcutaneous heparin and SCDs  CLINICALLY SIGNIFICANT STUDIES Basic Metabolic Panel:   Recent Labs Lab 06/08/13 1100 06/08/13 1116   NA 136 140  K 3.9 4.0  CL 101 105  CO2 26  --   GLUCOSE 100* 100*  BUN 9 8  CREATININE 0.87 0.90  CALCIUM 9.6  --    Liver Function Tests:   Recent Labs Lab 06/08/13 1100  AST 18  ALT 20  ALKPHOS 139*  BILITOT 0.8  PROT 7.4  ALBUMIN 4.0   CBC:   Recent Labs Lab 06/08/13 1100 06/08/13 1116  WBC 7.5  --   NEUTROABS 5.0  --   HGB 15.9 16.0  HCT 45.7 47.0  MCV 88.9  --   PLT 315  --    Coagulation:   Recent Labs Lab 06/08/13 1100  LABPROT 13.7  INR 1.07   Cardiac Enzymes:   Recent Labs Lab 06/08/13 1100  TROPONINI <0.30   Urinalysis: No results found for this basename: COLORURINE, APPERANCEUR, LABSPEC, PHURINE, GLUCOSEU, HGBUR, BILIRUBINUR, KETONESUR, PROTEINUR, UROBILINOGEN, NITRITE, LEUKOCYTESUR,  in the last 168 hours Lipid Panel    Component Value Date/Time   CHOL 210* 06/09/2013 0410   TRIG 218* 06/09/2013 0410   HDL 36* 06/09/2013 0410   CHOLHDL 5.8 06/09/2013 0410   VLDL 44* 06/09/2013 0410   LDLCALC 130* 06/09/2013 0410   HgbA1C  No results found for this basename: HGBA1C    Urine Drug Screen:   No results found for  this basename: labopia,  cocainscrnur,  labbenz,  amphetmu,  thcu,  labbarb    Alcohol Level:   Recent Labs Lab 06/08/13 1100  ETH <11    Dg Chest 2 View 06/08/2013 Cardiomegaly and low lung volumes, without acute disease.     Ct Head Wo Contrast 06/08/2013    No evidence of cortically based acute infarct. No acute intracranial hemorrhage identified. Nonspecific white matter changes.    MRI of the brain  pending  MRA of the brain  pending  2D Echocardiogram  pending  Carotid Doppler  pending  EKG  sinus rhythm rate 74 beats per minute  Therapy Recommendations pending  Physical Exam    Neurologic Examination:  Mental Status:  Alert, oriented, thought content appropriate. Speech fluent without evidence of aphasia. Able to follow commands. Effort was poor with manual testing of left extremities.  Cranial  Nerves:  II-Visual fields were normal.  III/IV/VI-Pupils were equal and reacted. Extraocular movements were full and conjugate.  V/VII-slight numbness of left hemiface with splitting of midline; no facial weakness.  VIII-normal.  X-normal speech.  Motor: Drift of left upper and lower extremities against gravity with poor and variable voluntary effort; motor exam is otherwise normal.  Sensory: Reduced perception of tactile sensation over left extremities compared to right extremities.  Deep Tendon Reflexes: 1+ and symmetric.  Plantars: Flexor bilaterally  Cerebellar: Normal finger-to-nose testing.   ASSESSMENT Mr. Bradley Prince is a 51 y.o. male presenting with left hemiparesis and left hemisensory deficits associated with a headache over his left eye.  TPA was not given as the patient's deficits were mild. CT scan negative for infarct. MRI/MRA pending. On aspirin 81 mg orally every day prior to admission. Now on aspirin 81 mg orally every day for secondary stroke prevention. Patient with resultant mild left upper and lower extremity numbness and weakness but nonorganic sensory loss with splitting of midline and variable weakness. Probable psychogenic component. Work up underway.   Hyperlipidemia - on Lipitor prior to admission. Cholesterol 210. LDL 130.  Hypertension history  Ongoing tobacco use  History of previous TIA  History of abdominal aortic aneurysm repair  Hospital day # 1  TREATMENT/PLAN  Continue aspirin 81 mg orally every day for secondary stroke prevention.  Await MRI/MRA, echo, carotid Dopplers, and hemoglobin A1c.  Await therapy evaluations.  Risk factor modification - smoking cessation.  Probable discharge tomorrow if the therapists agree.  Delton See PA-C [facility] Pager [phone] 06/09/2013, 10:57 AM  I have personally obtained a history, examined the patient, evaluated imaging results, and formulated the assessment and plan of  care. I agree with the above. Delia Heady, MD

## 2013-06-09 NOTE — Evaluation (Signed)
Speech Language Pathology Evaluation Patient Details Name: Bradley Prince MRN: [id number] DOB: 01/29/1961 Today's Date: 06/09/2013 Time: 1000-1030 SLP Time Calculation (min): 30 min  Problem List:  Patient Active Problem List   Diagnosis Date Noted  . Nausea alone 06/09/2013  . Dyslipidemia 06/09/2013  . Smoker 06/09/2013  . Left-sided weakness 06/08/2013  . Severe headache 06/08/2013  . Left sided numbness 06/08/2013  . Blurry vision 06/08/2013  . HTN (hypertension) 06/08/2013   Past Medical History:  Past Medical History  Diagnosis Date  . TIA (transient ischemic attack)   . Hypertension   . Hyperlipidemia    Past Surgical History:  Past Surgical History  Procedure Laterality Date  . Abdominal aortic aneurysm repair    . Cholecystectomy    . Appendectomy     HPI:  Bradley Prince is a 52 y.o. male with basilar history of hypertension and hyperlipidemia. Patient came in to the hospital because of acute left-sided weakness. Patient was off of work since June for the same above complaints. He works at [employer]. About 7:30 today he developed left-sided weakness, numbness and severe headache. Headache is around his left eye, patient also mentioned that his left eye was bleeding with some blurring of vision as well. Denies any slurred speech or facial asymmetry.   Assessment / Plan / Recommendation Clinical Impression  Pt. appears to have normal speech, language, cognitive function at this time.  He does, however, complain of dysphagia, reporting choking on food and liquids, with more difficulty with food.  Pt. reports a globus sensation and feels "throat is closing off" and pt. states he is unable to keep food down.  Suspect a primary esophageal dysphagia with a secondary pharyngeal componenent.  Pt. would benefit from a MBS for objective evaluation prior to continuing current diet.  Will contact MD to request order.    SLP Assessment   (Pt. needs MBS,  otherwise no f/u for speech/language)    Follow Up Recommendations       Frequency and Duration        Pertinent Vitals/Pain C/O headache (8).  RN aware.   SLP Goals     SLP Evaluation Prior Functioning  Cognitive/Linguistic Baseline: Within functional limits Type of Home: House Available Help at Discharge: Family;Available PRN/intermittently   Cognition  Overall Cognitive Status: Within Functional Limits for tasks assessed Arousal/Alertness: Awake/alert Orientation Level: Oriented X4 Attention: Divided Divided Attention: Appears intact Memory: Appears intact Awareness: Appears intact Problem Solving: Appears intact Safety/Judgment: Appears intact    Comprehension  Auditory Comprehension Overall Auditory Comprehension: Appears within functional limits for tasks assessed Yes/No Questions: Within Functional Limits Commands: Within Functional Limits Conversation: Complex Interfering Components: Pain Visual Recognition/Discrimination Discrimination: Within Function Limits Reading Comprehension Reading Status: Not tested    Expression Expression Primary Mode of Expression: Verbal Verbal Expression Overall Verbal Expression: Appears within functional limits for tasks assessed Initiation: No impairment Repetition: No impairment Naming: No impairment Pragmatics: No impairment Written Expression Written Expression: Not tested   Oral / Motor Oral Motor/Sensory Function Overall Oral Motor/Sensory Function: Appears within functional limits for tasks assessed Motor Speech Overall Motor Speech: Appears within functional limits for tasks assessed Respiration: Within functional limits Phonation: Normal Resonance: Within functional limits Articulation: Impaired Level of Impairment: Conversation Intelligibility: Intelligibility reduced Word: 75-100% accurate Conversation: 75-100% accurate Motor Planning: Witnin functional limits Motor Speech Errors: Not applicable    GO Functional Assessment Tool Used: Clinical judgement Functional Limitations: Swallowing Swallow Current Status (W2956): At least 40 percent but  less than 60 percent impaired, limited or restricted Swallow Goal Status [phone]): At least 1 percent but less than 20 percent impaired, limited or restricted   Maryjo Rochester T 06/09/2013, 11:05 AM

## 2013-06-09 NOTE — Plan of Care (Signed)
Dr Laural Benes paged because nurse gave pt 1 mg of ativan iv prior to MRI. Pt is down in MRI. States he needs more medication to calm him down. Waiting for MD johnson to call back.

## 2013-06-09 NOTE — Progress Notes (Signed)
[facility] was notified last night of pt persistent vomiting and the the zofran 4mg  po was ineffective. Pt had receive 2mg  of hydromorphone for pain. New orders received and 25mg  of phenergan IV was administered with relief. Will continue to monitor. Ilean Skill LPN

## 2013-06-09 NOTE — Evaluation (Signed)
Physical Therapy Evaluation Patient Details Name: Bradley Prince MRN: [id number] DOB: 03/17/1961 Today's Date: 06/09/2013 Time: 7829-5621 PT Time Calculation (min): 19 min  PT Assessment / Plan / Recommendation History of Present Illness  52 y.o. male admitted to [facility] on 06/08/13 with left sided weakness and severe HA.  Stroke workup pending.  CT (-).    Clinical Impression  The pt is moving well despite left sided weakness and numbness.  He is dragging the left foot and leg around and searching for bil hand support with gait.  I recommended he try a RW due to wanting stability from both arms.  He would benefit from OP PT f/u at discharge and says that he feels he could get to his appointments.  He lives in [place].   PT to follow acutely for deficits listed below.       PT Assessment  Patient needs continued PT services    Follow Up Recommendations  Outpatient PT;Supervision - Intermittent    Does the patient have the potential to tolerate intense rehabilitation     Yes  Barriers to Discharge Other (comment) (None) None    Equipment Recommendations  None recommended by PT    Recommendations for Other Services   None  Frequency Min 4X/week    Precautions / Restrictions Precautions Precautions: Fall Precaution Comments: left hemiperesis Restrictions Weight Bearing Restrictions: No   Pertinent Vitals/Pain See vitals flow sheet.      Mobility  Bed Mobility Bed Mobility: Supine to Sit;Sitting - Scoot to Edge of Bed;Sit to Supine Supine to Sit: 6: Modified independent (Device/Increase time);With rails;HOB flat Sitting - Scoot to Edge of Bed: 6: Modified independent (Device/Increase time);With rail Sit to Supine: 6: Modified independent (Device/Increase time);With rail;HOB flat Details for Bed Mobility Assistance: used railing to pull to sitting with right hand Transfers Transfers: Sit to Stand;Stand to Sit Sit to Stand: 4: Min assist;With upper extremity assist;From  bed Stand to Sit: 4: Min assist;With upper extremity assist;To bed Details for Transfer Assistance: min assist to stabilize pt for balance and block left knee for safety Ambulation/Gait Ambulation/Gait Assistance: 4: Min assist Ambulation Distance (Feet): 20 Feet Assistive device: 1 person hand held assist Ambulation/Gait Assistance Details: pt reaching for stability with free arm, so I discussed with him that he might benefit from using a RW now instead of just a cane.   Gait Pattern: Decreased step length - left;Decreased stance time - left;Decreased hip/knee flexion - left;Decreased dorsiflexion - left Modified Rankin (Stroke Patients Only) Pre-Morbid Rankin Score: Slight disability Modified Rankin: Moderately severe disability        PT Diagnosis: Abnormality of gait;Difficulty walking;Generalized weakness;Acute pain;Hemiplegia non-dominant side  PT Problem List: Decreased strength;Decreased activity tolerance;Decreased balance;Decreased mobility;Decreased coordination;Impaired sensation;Pain PT Treatment Interventions: DME instruction;Gait training;Stair training;Functional mobility training;Therapeutic activities;Therapeutic exercise;Balance training;Neuromuscular re-education;Patient/family education     PT Goals(Current goals can be found in the care plan section) Acute Rehab PT Goals Patient Stated Goal: to get better.  "This is so frustrating" PT Goal Formulation: With patient Time For Goal Achievement: 06/23/13 Potential to Achieve Goals: Good  Visit Information  Last PT Received On: 06/09/13 Assistance Needed: +1 History of Present Illness: 52 y.o. male admitted to [facility] on 06/08/13 with left sided weakness and severe HA.  Stroke workup pending.  CT (-).         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  Functionally decreased foot clearance with gait.   LLE Sensation: decreased light touch LLE Coordination: decreased fine motor;decreased gross motor (due to weakness) Cervical / Trunk Assessment Cervical / Trunk Assessment: Normal   Balance Balance Balance Assessed: Yes Static Sitting Balance Static Sitting - Balance Support: No upper extremity supported;Feet supported Static Sitting - Level of Assistance: 6: Modified independent (Device/Increase time) Dynamic Sitting Balance Dynamic Sitting - Balance Support: No upper extremity supported;Feet supported Dynamic Sitting - Level of Assistance: 5: Stand by assistance Dynamic Sitting - Comments: while putting on socks seated EOB Static Standing Balance Static Standing - Balance Support: Right upper extremity supported Static Standing - Level of Assistance: 4: Min assist Dynamic Standing Balance Dynamic Standing - Balance Support: Right upper extremity supported;Left upper extremity supported Dynamic Standing - Level of Assistance: 4: Min assist Dynamic Standing - Comments: min assist pt reaching for external support with both hands  End  of Session PT - End of Session Equipment Utilized During Treatment: Gait belt Activity Tolerance: Patient limited by fatigue;Patient limited by pain Patient left: in bed;with call bell/phone within reach  GP Functional Assessment Tool Used: pt assist level Functional Limitation: Mobility: Walking and moving around Mobility: Walking and Moving Around Current Status (Z6109): At least 20 percent but less than 40 percent impaired, limited or restricted Mobility: Walking and Moving Around Goal Status [phone]): At least 1 percent but less than 20 percent impaired, limited or restricted   Beverely Suen B. Gabbi Whetstone, PT, DPT [phone]   06/09/2013, 9:56 AM

## 2013-06-10 ENCOUNTER — Observation Stay (HOSPITAL_COMMUNITY): Payer: BC Managed Care – PPO

## 2013-06-10 ENCOUNTER — Other Ambulatory Visit: Payer: Self-pay

## 2013-06-10 DIAGNOSIS — E785 Hyperlipidemia, unspecified: Secondary | ICD-10-CM

## 2013-06-10 MED ORDER — SODIUM CHLORIDE 0.9 % IV SOLN
INTRAVENOUS | Status: DC
  Administered 2013-06-10 – 2013-06-11 (×2): via INTRAVENOUS

## 2013-06-10 MED ORDER — FLUCONAZOLE 100MG IVPB
100.0000 mg | INTRAVENOUS | Status: DC
  Administered 2013-06-10 – 2013-06-11 (×2): 100 mg via INTRAVENOUS
  Filled 2013-06-10 (×4): qty 50

## 2013-06-10 MED ORDER — HYDROMORPHONE HCL PF 1 MG/ML IJ SOLN
1.0000 mg | INTRAMUSCULAR | Status: DC | PRN
  Administered 2013-06-10 – 2013-06-12 (×7): 1 mg via INTRAVENOUS
  Filled 2013-06-10 (×7): qty 1

## 2013-06-10 NOTE — Progress Notes (Signed)
Physical Therapy Treatment Patient Details Name: [facility] MRN: [id number] DOB: 11/29/1960 Today's Date: 06/10/2013 Time: 8295-6213 PT Time Calculation (min): 31 min  PT Assessment / Plan / Recommendation  History of Present Illness 52 y.o. male admitted to [facility] on 06/08/13 with left sided weakness and severe HA.  Stroke workup pending.  CT (-).     PT Comments   A code stroke was called during MBS study today. Pt with regression in mobility status and increased confusion/altered mental status. Pt requiring increased assist this date with transfers/ambulation and con't to demo L sided weakness, co-ordination deficits and altered sensation in L hand and LE. Pt to strongly benefit from CIR to achieve safe mod I function for safe transition home. Pt very motivated and suspect would achieve mod I function by completion of extensive rehab program.   Follow Up Recommendations  CIR     Does the patient have the potential to tolerate intense rehabilitation     Barriers to Discharge        Equipment Recommendations  Rolling walker with 5" wheels    Recommendations for Other Services Rehab consult  Frequency Min 4X/week   Progress towards PT Goals Progress towards PT goals: Progressing toward goals  Plan Discharge plan needs to be updated    Precautions / Restrictions Precautions Precautions: Fall Precaution Comments: left hemiperesis Restrictions Weight Bearing Restrictions: No   Pertinent Vitals/Pain Denies pain    Mobility  Bed Mobility Bed Mobility: Supine to Sit;Sitting - Scoot to [place] of Bed;Sit to Supine Supine to Sit: 4: Min assist Sitting - Scoot to [place] of Bed: 4: Min assist Sit to Supine: 4: Min guard Details for Bed Mobility Assistance: pt required max directional v/c's, minA for trunk elevation and LE management back into bed Transfers Transfers: Sit to Stand;Stand to Sit Sit to Stand: 4: Min assist;With upper extremity assist Stand to Sit: With upper extremity  assist;To chair/3-in-1;3: Mod assist Details for Transfer Assistance: pt with increased anxiety reaching for objects to hold onto requiring v/c's to reach for arm rests of chair, assist to control descent into chair Ambulation/Gait Ambulation/Gait Assistance: 3: Mod assist Ambulation Distance (Feet): 40 Feet Assistive device: Rolling walker Ambulation/Gait Assistance Details: max v/c's to maintain eye opening, max v/c's to pick up Left foot to clear foot, assist pt with L hand on walker Gait Pattern: Left steppage;Lateral trunk lean to right;Left flexed knee in stance;Narrow base of support;Decreased dorsiflexion - left;Decreased stance time - left;Decreased step length - left;Step-to pattern Gait velocity: slow General Gait Details: pt with episode of feeling nausea/dizzy, saying "I feel like i'm going to pass out." Pt required to sit immeadiately. Mild tremor noted and head shaking. Pt reports "I'm so sorry I couldn't walk further. I really wanted to do good today." Stairs: No Modified Rankin (Stroke Patients Only) Pre-Morbid Rankin Score: Slight disability Modified Rankin: Moderately severe disability    Exercises General Exercises - Upper Extremity Shoulder Flexion: AROM;10 reps Shoulder ABduction: AROM;10 reps;Left Elbow Flexion: AROM;Left;10 reps General Exercises - Lower Extremity Ankle Circles/Pumps: AROM;Left;10 reps Long Arc Quad: AROM;Left;10 reps;Seated Hip Flexion/Marching: AROM;Left;10 reps;Seated;Standing   PT Diagnosis:    PT Problem List:   PT Treatment Interventions:     PT Goals (current goals can now be found in the care plan section) Acute Rehab PT Goals Patient Stated Goal: to walk again  Visit Information  Last PT Received On: 06/10/13 Assistance Needed: +1 History of Present Illness: 52 y.o. male admitted to [facility] on 06/08/13 with  left sided weakness and severe HA.  Stroke workup pending.  CT (-).      Subjective Data  Subjective: Pt received supine in bed  with report "I just don't feel well, but I want to walk." Patient Stated Goal: to walk again   Cognition  Cognition Arousal/Alertness: Awake/alert Behavior During Therapy: WFL for tasks assessed/performed Overall Cognitive Status: Impaired/Different from baseline Area of Impairment: Orientation;Safety/judgement Orientation Level: Place;Time Memory: Decreased short-term memory Safety/Judgement: Decreased awareness of safety General Comments: pt slow processing, increased response time    Balance  Static Sitting Balance Static Sitting - Balance Support: No upper extremity supported;Feet supported Static Sitting - Level of Assistance: 5: Stand by assistance Static Sitting - Comment/# of Minutes: 5 Static Standing Balance Static Standing - Balance Support: Bilateral upper extremity supported Static Standing - Level of Assistance: 4: Min assist Static Standing - Comment/# of Minutes: 3 min  End of Session PT - End of Session Equipment Utilized During Treatment: Gait belt Activity Tolerance: Patient limited by fatigue Patient left: in bed;with call bell/phone within reach;with family/visitor present Nurse Communication: Mobility status   GP     Marcene Brawn 06/10/2013, 4:22 PM  Lewis Shock, PT, DPT Pager #: [phone] Office #: [phone]

## 2013-06-10 NOTE — Progress Notes (Signed)
Chaplain visited with pt and his wife.  Chaplain offered emotional and spiritual support through his presence, conversation and prayer.  Pt and his wife expressed gratitude for Chaplain's visit.   06/10/13 1700  Clinical Encounter Type  Visited With Patient and family together  Visit Type Spiritual support  Referral From Nurse  Spiritual Encounters  Spiritual Needs Emotional;Prayer  Stress Factors  Patient Stress Factors Lack of knowledge    [facility]

## 2013-06-10 NOTE — Progress Notes (Signed)
Rehab Admissions Coordinator Note:  Patient was screened by Clois Dupes for appropriateness for an Inpatient Acute Rehab Consult.  At this time, we are recommending OP. Reviewed neurology workup and findings. Recent MRI no acute findings. Please call me with any questions.  Clois Dupes 06/10/2013, 4:53 PM  I can be reached at [phone].

## 2013-06-10 NOTE — Progress Notes (Signed)
SLP Progress Note  Patient Details Name: Bradley [facility] MRN: [id number] DOB: 1961/07/25   Re: MBS: Pt was seen in radiology for MBS, alert and conversant, apologetic. Pt was noted to exhibit a dry nonproductive cough intermittently prior to po trials.  He was given one 1/2 tsp bolus of applesauce with barium. Orally, normal bolus control and posterior propulsion were noted. Pharyngeally, normal swallow reflex, no penetration or aspiration, and no post-swallow residual noted.  Pt then exhibited same dry nonproductive cough, and reported feeling like it didn't want to go down, and wanted to come back up.  Subsequent boluses were held, as pt became less interactive, exhibited a slight tremor of his head, and verbalized being confused. Code stroke was called.  Team arrived and evaluated pt in radiology suite.  Dr. Thad Ranger decided to hold MBS and keep pt NPO for now.  Pt was taken back to 4N, and RN was updated.  [name] Natal [facility], CCC-SLP [phone]   [facility] 06/10/2013, 11:26 AM

## 2013-06-10 NOTE — Procedures (Signed)
Objective Swallowing Evaluation: Modified Barium Swallowing Study  Patient Details  Name: Bradley Prince MRN: [id number] Date of Birth: [birth year]  Today's Date: 06/10/2013 Time: 1010-1110 SLP Time Calculation (min): 60 min  Past Medical History:  Past Medical History  Diagnosis Date  . TIA (transient ischemic attack)   . Hypertension   . Hyperlipidemia    Past Surgical History:  Past Surgical History  Procedure Laterality Date  . Abdominal aortic aneurysm repair    . Cholecystectomy    . Appendectomy     HPI:  Bradley Prince is a 52 y.o. male with basilar history of hypertension and hyperlipidemia. [facility] in to [facility] because of acute left-sided weakness. Patient was off of work since June for the same above complaints. He works at [employer]. About 7:30 today he developed left-sided weakness, numbness and severe headache. Headache is around his left eye, patient also mentioned that his left eye was bleeding with some blurring of vision as well. Denied any slurred speech or facial asymmetry during SLP evaluation yesterday. MBS was recommended to objectively assess swallow function and safety.     Assessment / Plan / Recommendation Clinical Impression  Clinical impression: Pt was given one (1/2tsp) bolus of puree/barium.  This one swallow was noted to be functional.  Oral prep and propulsion was WNL, Swallow reflex was timely, no penetration, aspiration or post-swallow residue was seen.  Prior to presentation of subsequent boluses, pt status was noted by SLP and rad tech to change.  Pt became less interactive, and exhibited a delay in responding to questions.  A slight tremor of his head was noted , and pt reported feeling confused.  Pt was apologetic, stating he didn't mean to get sick, and that he just wanted to go home.  At that time, SLP and rad tech agreed that MBS should be discontinued, and medical team should be contacted to evaluate patient.  SLP  stayed with the patient during RN/MD evaluation.  Decision was made to cancel the MBS and change pt status to NPO, pending further observation/tests/  SLP assisted rad tech with taking pt  back to his room (per pt request).     Treatment Recommendation  Therapy as outlined in treatment plan below    Diet Recommendation NPO (per Dr. Thad Ranger. MBS results inconclusive.)   Medication Administration: Via alternative means    Other  Recommendations Oral Care Recommendations: Oral care QID   Follow Up Recommendations       Frequency and Duration min 1 x/week  1 week   Pertinent Vitals/Pain Headache 7-8/10. RN informed     General Date of Onset: 06/08/13 HPI: Bradley Prince is a 51 y.o. male with basilar history of hypertension and hyperlipidemia. Patient came in to the [facility] because of acute left-sided weakness. Patient was off of work since June for the same above complaints. He works at [employer]. About 7:30 today he developed left-sided weakness, numbness and severe headache. Headache is around his left eye, patient also mentioned that his left eye was bleeding with some blurring of vision as well. Denied any slurred speech or facial asymmetry during SLP evaluation yesterday. MBS was recommended to objectively assess swallow function and safety. Type of Study: Modified Barium Swallowing Study Reason for Referral: Objectively evaluate swallowing function Previous Swallow Assessment: SLE yesterday, during which pt reported difficulty swallowing. MBS was recommended at that time. Diet Prior to this Study: Dysphagia 3 (soft);Nectar-thick liquids Temperature Spikes Noted: No Respiratory Status: Room  air History of Recent Intubation: No Behavior/Cognition: Alert;Cooperative;Pleasant mood (pt reported becoming confused after MBS initiated) Oral Cavity - Dentition: Adequate natural dentition Oral Motor / Sensory Function: Within functional limits Self-Feeding Abilities:  Needs assist Patient Positioning: Upright in chair Baseline Vocal Quality: Clear Volitional Cough: Weak Volitional Swallow: Able to elicit Anatomy: Within functional limits Pharyngeal Secretions: Not observed secondary MBS    Reason for Referral Objectively evaluate swallowing function   Oral Phase Oral Preparation/Oral Phase Oral Phase: WFL (limited study - Only 1 bolus given)   Pharyngeal Phase Pharyngeal Phase Pharyngeal Phase: Within functional limits (limited study - only 1 bolus given)  Cervical Esophageal Phase    GO    Cervical Esophageal Phase Cervical Esophageal Phase: WFL (limited study - only 1 bolus given)    Functional Assessment Tool Used: Clinical judgement Functional Limitations: Swallowing Swallow Current Status (J4782): At least 40 percent but less than 60 percent impaired, limited or restricted Swallow Goal Status [phone]): At least 1 percent but less than 20 percent impaired, limited or restricted   [facility], CCC-SLP [phone] [name] 06/10/2013, 1:38 PM

## 2013-06-10 NOTE — Progress Notes (Signed)
Speech Language Pathology Dysphagia Treatment Patient Details Name: Johnryan Sao MRN: [id number] DOB: 04/15/1961 Today's Date: 06/10/2013 Time: 5784-6962 SLP Time Calculation (min): 10 min  Assessment / Plan / Recommendation Clinical Impression  Pt appears to be improved from this morning. Pt recalled some specific details of our conversation earlier.  Wife present.  SLP explained the events surrounding the MBS this morning, providing information and answering her questions.  Pt continues to be NPO.  Hopefully pt will be able to participate in MBS again soon to objectively assess swallow function and safety. ST to follow for improvement in status and readiness for MBS.      Diet Recommendation  Continue with Current Diet: NPO    SLP Plan     Pertinent Vitals/Pain 9/10 headache   Swallowing Goals   pending completion of MBS  General Temperature Spikes Noted: No Respiratory Status: Room air Behavior/Cognition: Alert;Cooperative;Pleasant mood Oral Cavity - Dentition: Adequate natural dentition Patient Positioning: Partially reclined  Oral Cavity - Oral Hygiene Does patient have any of the following "at risk" factors?: Diet - patient on thickened liquids Brush patient's teeth BID with toothbrush (using toothpaste with fluoride): Yes   Dysphagia Treatment Treatment focused on: Patient/family/caregiver education Family/Caregiver Educated: Wife Remigio Eisenmenger Patient observed directly with PO's: No Reason PO's not observed: Other (comment) (NPO per MD)   GO Functional Assessment Tool Used: Clinical judgement Functional Limitations: Swallowing Swallow Current Status ([id number]): At least 40 percent but less than 60 percent impaired, limited or restricted Swallow Goal Status [phone]): At least 1 percent but less than 20 percent impaired, limited or restricted  Celia B. [place], [facility], CCC-SLP [phone]  [name] 06/10/2013, 3:54 PM

## 2013-06-10 NOTE — Progress Notes (Addendum)
Addendum: (Code Stroke) Patient was in Xray obtaining his MBS. During study he was able to take a few swallows of puree food but then stopped swallowing and stated "I am confused".  At that time a Code stroke was called and patient was met down in X-ray.  On exam patient was awake, able to answer questions and follow commands. He was moving his right arm and leg with full strength. Left arm was able to hold antigravity with slight drift but showed decreased effort.  Left leg showed full strength. Continues to split midline on forehead and nose. Shows strong strength when turning head to the right and poor effort when turning head to the left.  Swallow screen was canceled and patient placed back on NPO.  Currently patient is not confused.  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 MRA showed no significant stenosis. Patient will return to 4n and remain on telemetry. Stroke team will continue to follow.   Patient seen and examined.  Clinical course and management discussed.  Necessary edits performed.  I agree with the above.  Assessment and plan of care developed and discussed below.    Thana Farr, MD [facility] [phone]  06/10/2013  12:31 PM

## 2013-06-10 NOTE — Progress Notes (Signed)
Stroke Team Progress Note  HISTORY Bradley [facility] is a 52 y.o. male with a history of hypertension as well as recurrent episodes of left-sided numbness with associated headache, who presented 06/08/2013 with recurrent headache with left-sided numbness as well as weakness. Last known well at 7:30 AM 06/08/2013. CT scan of his head showed no acute intracranial abnormality. Patient had no clear speech change. He was complaining of blurring of vision involving his left eye but no other visual changes. NIH stroke score was 3.   LSN: 7:30 AM on 06/08/2013  tPA Given: No: Mild deficits only with psychogenic factors.  MRankin: 0   SUBJECTIVE The patient recently returned from a swallowing evaluation and radiology. Apparently a code stroke was called while the patient was in radiology. The patient states that he just became confused. He now has a headache. He does have a neurologist in [place] who follows him for his headaches. We will treat symptomatically while he is an inpatient.  OBJECTIVE Most recent Vital Signs: Filed Vitals:   06/09/13 2112 06/10/13 0110 06/10/13 0332 06/10/13 0535  BP: 107/63 109/65 117/65 116/70  Pulse: 72 65 64 57  Temp: 98 F (36.7 C) 98.1 F (36.7 C)  97.8 F (36.6 C)  TempSrc: Oral Oral  Oral  Resp: 18 18  18   Height:      Weight:      SpO2: 94% 95%  95%   CBG (last 3)   Recent Labs  06/08/13 1052  GLUCAP 92    IV Fluid Intake:     MEDICATIONS  . aspirin EC  81 mg Oral Daily  . atorvastatin  40 mg Oral Daily  . heparin  5,000 Units Subcutaneous Q8H  . influenza vac split quadrivalent PF  0.5 mL Intramuscular Tomorrow-1000  . lisinopril  20 mg Oral Daily  . pantoprazole  80 mg Oral Daily  . varenicline  0.5 mg Oral BID  . verapamil  40 mg Oral BID   PRN:  acetaminophen, diphenhydrAMINE, HYDROmorphone (DILAUDID) injection, LORazepam, ondansetron, promethazine  Diet:  NPO thin liquids Activity:  Up with assistance DVT Prophylaxis:  Subcutaneous  heparin and SCDs  CLINICALLY SIGNIFICANT STUDIES Basic Metabolic Panel:   Recent Labs Lab 06/08/13 1100 06/08/13 1116  NA 136 140  K 3.9 4.0  CL 101 105  CO2 26  --   GLUCOSE 100* 100*  BUN 9 8  CREATININE 0.87 0.90  CALCIUM 9.6  --    Liver Function Tests:   Recent Labs Lab 06/08/13 1100  AST 18  ALT 20  ALKPHOS 139*  BILITOT 0.8  PROT 7.4  ALBUMIN 4.0   CBC:   Recent Labs Lab 06/08/13 1100 06/08/13 1116  WBC 7.5  --   NEUTROABS 5.0  --   HGB 15.9 16.0  HCT 45.7 47.0  MCV 88.9  --   PLT 315  --    Coagulation:   Recent Labs Lab 06/08/13 1100  LABPROT 13.7  INR 1.07   Cardiac Enzymes:   Recent Labs Lab 06/08/13 1100  TROPONINI <0.30   Urinalysis: No results found for this basename: COLORURINE, APPERANCEUR, LABSPEC, PHURINE, GLUCOSEU, HGBUR, BILIRUBINUR, KETONESUR, PROTEINUR, UROBILINOGEN, NITRITE, LEUKOCYTESUR,  in the last 168 hours Lipid Panel    Component Value Date/Time   CHOL 210* 06/09/2013 0410   TRIG 218* 06/09/2013 0410   HDL 36* 06/09/2013 0410   CHOLHDL 5.8 06/09/2013 0410   VLDL 44* 06/09/2013 0410   LDLCALC 130* 06/09/2013 0410   HgbA1C  Lab  Results  Component Value Date   HGBA1C 5.9* 06/09/2013    Urine Drug Screen:   No results found for this basename: labopia,  cocainscrnur,  labbenz,  amphetmu,  thcu,  labbarb    Alcohol Level:   Recent Labs Lab 06/08/13 1100  ETH <11    Dg Chest 2 View 06/08/2013 Cardiomegaly and low lung volumes, without acute disease.     Ct Head Wo Contrast 06/08/2013    No evidence of cortically based acute infarct. No acute intracranial hemorrhage identified. Nonspecific white matter changes.    MRI of the brain  Patchy and confluent areas of white matter hyperintensity  bilaterally. Given the history of hypertension and hyperlipidemia  and smoking, this is most likely all due to chronic microvascular  ischemia. No acute infarct or mass.   MRA of the brain   Negative intracranial  MRA.   2D Echocardiogram  - ejection fraction 55-60%. 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 (possibly calcified nodule) on the non-coronary cusp which appears adherent to the valve, but may be minimally mobile. Consider TEE for further evaluation if clinically indicated.    Carotid Doppler  Bilateral: 1-39% ICA stenosis. Vertebral artery flow is antegrade.   EKG  sinus rhythm rate 74 beats per minute  Therapy Recommendations - outpatient physical therapy with intermittent supervision has been recommended.  Physical Exam    Neurologic Examination:  Mental Status:  Alert, oriented, thought content appropriate. Speech fluent without evidence of aphasia. Able to follow commands. Effort was poor with manual testing of left extremities.  Cranial Nerves:  II-Visual fields were normal.  III/IV/VI-Pupils were equal and reacted. Extraocular movements were full and conjugate.  V/VII-slight numbness of left hemiface with splitting of midline; no facial weakness.  VIII-normal.  X-normal speech.  Motor: Drift of left upper and lower extremities against gravity with poor and variable voluntary effort; motor exam is otherwise normal.  Sensory: Reduced perception of tactile sensation over left extremities compared to right extremities.  Deep Tendon Reflexes: 1+ and symmetric.  Plantars: Flexor bilaterally  Cerebellar: Normal finger-to-nose testing.   ASSESSMENT Mr. Bradley [facility] is a 52 y.o. male presenting with left hemiparesis and left hemisensory deficits associated with a headache over his left eye.  TPA was not given as the patient's deficits were mild. CT scan negative for infarct. MRI/MRA pending. On aspirin 81 mg orally every day prior to admission. Now on aspirin 81 mg orally every day for secondary stroke prevention. Patient with resultant mild left upper and lower extremity numbness and weakness but nonorganic sensory loss with splitting of midline and  variable weakness. Probable psychogenic component. Work up underway.   Hyperlipidemia - on Lipitor prior on admission. Cholesterol 210. LDL 130.  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 May need TEE.  Hospital day # 2  TREATMENT/PLAN  Continue aspirin 81 mg orally every day for secondary stroke prevention.  Outpatient physical therapy with intermittent supervision.  Risk factor modification - smoking cessation.  Discuss TEE with Dr. Pearlean Brownie.  Delton See PA-C [facility] Pager [phone] 06/10/2013, 12:46 PM  I have personally obtained a history, examined the patient, evaluated imaging results, and formulated the assessment and plan of care. I agree with the above. Delia Heady, MD

## 2013-06-10 NOTE — Progress Notes (Signed)
Pt complained of heavy feeling on his chest radiating to lt shoulder and arm. Kept pt on semi-fowler's position and placed on O2 2lpm via nasal canula. EKG done and shows NSR. Ativan 1mg  IV given for anxiety and Dilaudid 1mg  IV given for c/o chest pain and headache. MD on call was notified and new orders carried out. Will continue to monitor.

## 2013-06-10 NOTE — Progress Notes (Signed)
TEE scheduled for 3 pm tomorrow. Orders written.

## 2013-06-10 NOTE — Evaluation (Addendum)
Occupational Therapy Evaluation Patient Details Name: Bradley Prince MRN: [id number] DOB: 12/02/1960 Today's Date: 06/10/2013 Time: 4098-1191 OT Time Calculation (min): 24 min  OT Assessment / Plan / Recommendation History of present illness 52 y.o. male admitted to [facility] on 06/08/13 with left sided weakness and severe HA.  Stroke workup pending.  CT (-).     Clinical Impression   Pt presents with below problem list. Pt independent with ADLs and used assistive device, PTA. Pt will benefit from acute OT to increase independence with ADLs prior to d/c.     OT Assessment  Patient needs continued OT Services    Follow Up Recommendations  CIR;Supervision/Assistance - 24 hour    Barriers to Discharge      Equipment Recommendations  3 in 1 bedside comode    Recommendations for Other Services  Rehab Consult  Frequency  Min 2X/week    Precautions / Restrictions Precautions Precautions: Fall Precaution Comments: left hemiperesis Restrictions Weight Bearing Restrictions: No   Pertinent Vitals/Pain Notified nurse of pt coughing.     ADL  Grooming: Teeth care;Min guard Where Assessed - Grooming: Supported standing Upper Body Bathing: Minimal assistance Where Assessed - Upper Body Bathing: Unsupported sitting (sitting EOB; back unsupported but UE may have been supported) Lower Body Bathing: Minimal assistance Where Assessed - Lower Body Bathing: Supported sit to stand Upper Body Dressing: Minimal assistance Where Assessed - Upper Body Dressing: Unsupported sitting (sitting EOB; back unsupported but UE may have been supported) Lower Body Dressing: Minimal assistance Where Assessed - Lower Body Dressing: Supported sit to stand Toilet Transfer: Hydrographic surveyor Method: Sit to Barista: Comfort height toilet Toileting - Clothing Manipulation and Hygiene: Minimal assistance Where Assessed - Engineer, mining and Hygiene: Sit to stand from  3-in-1 or toilet Tub/Shower Transfer Method: Not assessed Equipment Used: Gait belt;Rolling walker Transfers/Ambulation Related to ADLs: Min guard-cues for positioning of Lt hand on walker and to avoid grabbing furniture. Min guard for transfers. ADL Comments: Pt ambulated in hallway and also in bathroom to practice toilet transfer and perform teeth care. Pt coughing during teeth care task and OT educated to not swallow water-nurse notified of coughing.   Educated pt to try to use left hand for tasks.    OT Diagnosis: Generalized weakness;Acute pain  OT Problem List: Decreased strength;Decreased activity tolerance;Impaired balance (sitting and/or standing);Decreased safety awareness;Decreased knowledge of use of DME or AE;Decreased knowledge of precautions;Pain;Impaired UE functional use;Impaired vision/perception;Decreased coordination OT Treatment Interventions: Self-care/ADL training;Neuromuscular education;DME and/or AE instruction;Therapeutic activities;Patient/family education;Balance training;Visual/perceptual remediation/compensation;Therapeutic exercise   OT Goals(Current goals can be found in the care plan section) Acute Rehab OT Goals Patient Stated Goal: To get better OT Goal Formulation: With patient Time For Goal Achievement: 06/17/13 Potential to Achieve Goals: Good ADL Goals Pt Will Perform Grooming: with modified independence;standing Pt Will Perform Upper Body Dressing: with modified independence;sitting Pt Will Perform Lower Body Dressing: with set-up;with supervision;sit to/from stand Pt Will Transfer to Toilet: with modified independence;ambulating (3 in 1 over commode) Pt Will Perform Toileting - Clothing Manipulation and hygiene: with modified independence;sit to/from stand Pt Will Perform Tub/Shower Transfer: Shower transfer;with supervision;ambulating;shower seat;rolling walker  Visit Information  Last OT Received On: 06/10/13 Assistance Needed: +1 History of  Present Illness: 52 y.o. male admitted to [facility] on 06/08/13 with left sided weakness and severe HA.  Stroke workup pending.  CT (-).         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 Communication: No difficulties         Vision/Perception Vision - History Baseline Vision: Other (comment) (states he has haziness of left eye and difficulty with peripheal vision at baseline) Patient Visual Report: Diplopia;Blurring of vision (says haziness has gotten worse) Vision - Assessment Vision Assessment: Vision tested Tracking/Visual Pursuits: Other (comment) (difficulty tracking to left side) Visual Fields: Other (comment) (could not see in Left upper or lower quadrant)   Cognition  Cognition Arousal/Alertness: Awake/alert Behavior During Therapy: WFL for tasks assessed/performed Overall Cognitive Status: Impaired/Different from baseline Area of Impairment: Orientation;Safety/judgement Orientation Level: Disoriented to;Place;Time (looked at board when asked where he was at ) Safety/Judgement: Decreased awareness of safety    Extremity/Trunk Assessment Upper Extremity Assessment Upper Extremity Assessment: LUE deficits/detail LUE Deficits / Details: Less than 90 degrees of AROM shoulder flexion; weak grasp LUE Sensation: decreased light touch LUE Coordination: decreased fine motor ( slow and had to concentrate with opposition of fingers)     Mobility Bed Mobility Bed Mobility: Supine to Sit;Sitting - Scoot to Edge of Bed;Sit to  Supine Supine to Sit: 5: Supervision Sitting - Scoot to Edge of Bed: 5: Supervision Sit to Supine: 5: Supervision Transfers Transfers: Sit to Stand;Stand to Sit Sit to Stand: 4: Min guard;With upper extremity assist;From chair/3-in-1;From bed Stand to Sit: 4: Min guard;With upper extremity assist;To chair/3-in-1;To bed     Exercise     Balance     End of Session OT - End of Session Equipment Utilized During Treatment: Gait belt;Rolling walker Activity Tolerance: Patient tolerated treatment well Patient left: in bed;with call bell/phone within reach;with bed alarm set Nurse Communication: Other (comment) (coughing)  GO Functional Assessment Tool Used: clinical judgment Functional Limitation: Self care Self Care Current Status (Z6109): At least 20 percent but less than 40 percent impaired, limited or restricted Self Care Goal Status (U0454): At least 1 percent but less than 20 percent impaired, limited or restricted    Earlie Raveling OTR/L [phone] 06/10/2013, 2:31 PM

## 2013-06-10 NOTE — Progress Notes (Signed)
[facility] PROGRESS NOTE  Bradley Prince MRN:[id number] DOB: 11/20/1960 DOA: 06/08/2013 PCP: Candice P Clark, NP  Assessment/Plan: Principal Problem:   Left-sided weakness Active Problems:   Severe headache   Left sided numbness   Blurry vision   HTN (hypertension)   Nausea alone   Dyslipidemia   Smoker    1. Left-sided weakness/Headache: Patient presented with a left-sided weakness and severe headache. Dr Charles Stewart provided neurology consultation, and patient underwent CVA/TIA workup. Brain MRI/ MRA showed no evidence of acute or concerning findings, vascular duplex revealed 1-39% ICA stenosis bilaterally and antegrade vertebral artery flow. Headache responded to analgesics. Patient was subsequently seen by Dr Pramod Sethi, who feel that there may be a functional overlay, to patient's symptoms. Patient is on low dose ASA, and weakness has improved. PT evaluated and recommended outpatient PT, but OT feels CIR may be considered. Will consult rehab MD. Patient was unable to complete MBS today. Although he is recommended D3 diet/Thin. NPO today.  2. Cervical DJD/Radiculopathry: MR of the C-spine showed shallow disc osteophyte complexes bilaterally with mild right foraminal stenosis possibly irritating the right C6 nerve root, small right paracentral disc protrusion at C6-7 and mild right foraminal stenosis. 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  2. Hypertension: Controlled on pre-admission antihypertensives.  3. Dyslipidemia: Patient was on Lipitor pre-admission, and claims compliance. Lipid profile revealed TC 210, TG 218, HDL 36, LDL 130. Target LDL should be <70. Have added Zetia to treatment.  4. Tobacco User: The patient was counseled on the dangers of tobacco use, and was advised to quit. Reviewed strategies to maximize success, including removing cigarettes and smoking materials from environment, stress management and support of family/friends. 5. Aortic valve mass: 2D  Echocardiogram of 06/09/13, revealed a small 4 mm round, echogenic mass (possibly calcified nodule) on the non-coronary cusp which appears adherent to the valve, but may be minimally mobile. The significance is uncertain, and the neurology team do not feel that this is related to patient's presenting symptomatology. Have consulted  cardiology for TEE. This will likely be done on 06/11/13.  6. Oral thrush: An incidental finding on physical examination of 06/10/13. Will manage with 7-day course of Diflucan.     Code Status: Full Code.  Family Communication:  Disposition Plan: To be determined.    Brief narrative: 52 y.o. male with history of hypertension, TIA, AAA, s/p repair and hyperlipidemia. Patient came in to the hospital because of acute left-sided weakness. Patient was off of work since June for the same above complaints. He works at [employer] store. At about 7:30 AM on 06/08/13, he developed left-sided weakness, numbness and severe headache. Headache was around his left eye, associated blurring of vision as well. Denied any slurred speech or facial asymmetry.  In the ED patient evaluated by neurology and workup for TIA, recommended.   Consultants:  Dr Charles Stewart, neurologist.   Procedures:  CXR.  Head CT scan.  Brain MRI/MRA.   MRI Cervical spine.  MBS.   Antibiotics:  N/A.   HPI/Subjective: No new issues. Was unable to tolerate MBS today. Currently NPO.   Objective: Vital signs in last 24 hours: Temp:  [97.8 F (36.6 C)-98.3 F (36.8 C)] 98.3 F (36.8 C) (09/24 1408) Pulse Rate:  [57-79] 78 (09/24 1408) Resp:  [18] 18 (09/24 1408) BP: (107-148)/(63-89) 148/89 mmHg (09/24 1408) SpO2:  [94 %-100 %] 97 % (09/24 1408) Weight change:  Last BM Date: 06/08/13  Intake/Output from previous day: 09/23   0701 - 09/24 0700 In: 120 [P.O.:120] Out: 175 [Urine:175]     Physical Exam: General: Comfortable, alert, communicative, fully oriented, not  short of breath at rest.  HEENT:  No clinical pallor, no jaundice, no conjunctival injection or discharge. Hydration status is satisfactory.  NECK:  Supple, JVP not seen, no carotid bruits, no palpable lymphadenopathy, no palpable goiter. CHEST:  Clinically clear to auscultation, no wheezes, no crackles. HEART:  Sounds 1 and 2 heard, normal, regular, no murmurs. ABDOMEN:  Full, soft, non-tender, no palpable organomegaly, no palpable masses, normal bowel sounds. GENITALIA:  Not examined. LOWER EXTREMITIES:  No pitting edema, palpable peripheral pulses. MUSCULOSKELETAL SYSTEM:  Unremarkable. CENTRAL NERVOUS SYSTEM:  No focal neurologic deficit on gross examination.  Lab Results:  Recent Labs  06/08/13 1100 06/08/13 1116  WBC 7.5  --   HGB 15.9 16.0  HCT 45.7 47.0  PLT 315  --     Recent Labs  06/08/13 1100 06/08/13 1116  NA 136 140  K 3.9 4.0  CL 101 105  CO2 26  --   GLUCOSE 100* 100*  BUN 9 8  CREATININE 0.87 0.90  CALCIUM 9.6  --    No results found for this or any previous visit (from the past 240 hour(s)).   Studies/Results: Dg Chest 2 View  06/08/2013   CLINICAL DATA:  Stroke. Hypertension. Hyperlipidemia.  EXAM: CHEST  2 VIEW  COMPARISON:  None.  FINDINGS: Lateral view degraded by patient arm position. Prior median sternotomy. Patient rotated to the left on the frontal film. Cardiomegaly. Mildly low lung volumes. No pleural effusion or pneumothorax. Clear lungs.  IMPRESSION: Cardiomegaly and low lung volumes, without acute disease.   Electronically Signed   By: Kyle  Talbot   On: 06/08/2013 20:02   Mr Brain Wo Contrast  06/09/2013   CLINICAL DATA:  Stroke. Left-sided weakness. Headache.  EXAM: MRI HEAD WITHOUT CONTRAST  MRA HEAD WITHOUT CONTRAST  TECHNIQUE: Multiplanar, multiecho pulse sequences of the brain and surrounding structures were obtained without intravenous contrast. Angiographic images of the head were obtained using MRA technique without contrast.   COMPARISON:  CT 06/08/2013  FINDINGS: MRI HEAD FINDINGS  Ventricle size is normal. Pituitary normal in size. Craniocervical junction normal.  Patchy hyperintensity in the cerebral white matter bilaterally. Confluent hyperintensity in the subcortical frontal white matter bilaterally. This is symmetric and slightly more pronounced on the left.  Negative for acute infarct. Negative for hemorrhage. No mass lesion is present.  MRA HEAD FINDINGS  Both vertebral arteries are patent to the basilar. PICA patent bilaterally. Basilar is widely patent. AICA, superior cerebellar, and posterior cerebral arteries are all widely patent.  Right internal carotid artery widely patent. Right anterior and middle cerebral arteries are normal  Left internal carotid artery is widely patent. Left anterior and middle cerebral arteries are normal  Negative for cerebral aneurysm.  IMPRESSION: MRI HEAD IMPRESSION  Patchy and confluent areas of white matter hyperintensity bilaterally. Given the history of hypertension and hyperlipidemia and smoking, this is most likely all due to chronic microvascular ischemia. No acute infarct or mass.  MRA HEAD IMPRESSION  Negative intracranial MRA.   Electronically Signed   By: Charles  Clark M.D.   On: 06/09/2013 15:51   Mr Cervical Spine Wo Contrast  06/09/2013   CLINICAL DATA:  Neck and left arm pain.  EXAM: MRI CERVICAL SPINE WITHOUT CONTRAST  TECHNIQUE: Multiplanar, multisequence MR imaging was performed. No intravenous contrast was administered.  COMPARISON:  None  FINDINGS: The   cervical vertebral bodies are normally aligned and demonstrate normal marrow signal. The cervical spinal cord demonstrates a small focal syrinx at C7. No cord lesions.  Mild to moderate facet disease is noted. No abnormal STIR signal intensity in the paraspinal muscles or posterior elements.  C2-3: No significant findings.  C3-4:  No significant findings.  C4-5:  No significant findings.  C5-6 central and slightly left  paracentral disc protrusion with mild mass effect on the left side of the thecal sac. Shallow disc osteophyte complexes bilaterally with mild right foraminal stenosis possibly irritating the right C6 nerve root.  C6-7: Small right paracentral disc protrusion with mild focal mass effect on the ventral thecal sac and narrowing of the ventral CSF space. Shallow disc osteophyte complexes bilaterally with mild right-sided foraminal stenosis.  C7-T1:  No significant findings.  IMPRESSION: 1. Central and slightly left paracentral disc protrusion at C5-6. Shallow disc osteophyte complexes bilaterally with mild right foraminal stenosis possibly irritating the right C6 nerve root. 2. Small right paracentral disc protrusion at C6-7 and mild right foraminal stenosis. 3. 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   Electronically Signed   By: Mark  Gallerani M.D.   On: 06/09/2013 16:06   Dg Swallowing Func-speech Pathology  06/10/2013   Celia B Bueche, CCC-SLP     06/10/2013  1:39 PM Objective Swallowing Evaluation: Modified Barium Swallowing Study   Patient Details  Name: Bradley Prince MRN: [id number] Date of Birth: 08/13/1961  Today's Date: 06/10/2013 Time: 1010-1110 SLP Time Calculation (min): 60 min  Past Medical History:  Past Medical History  Diagnosis Date  . TIA (transient ischemic attack)   . Hypertension   . Hyperlipidemia    Past Surgical History:  Past Surgical History  Procedure Laterality Date  . Abdominal aortic aneurysm repair    . Cholecystectomy    . Appendectomy     HPI:  Bradley Prince is a 52 y.o. male with basilar history of  hypertension and hyperlipidemia. Patient came in to the hospital  because of acute left-sided weakness. Patient was off of work  since June for the same above complaints. He works at [employer]. About 7:30 today he developed left-sided  weakness, numbness and severe headache. Headache is around his  left eye, patient also mentioned that his left eye was bleeding  with some  blurring of vision as well. Denied any slurred speech  or facial asymmetry during SLP evaluation yesterday. MBS was  recommended to objectively assess swallow function and safety.     Assessment / Plan / Recommendation Clinical Impression  Clinical impression: Pt was given one (1/2tsp) bolus of  puree/barium.  This one swallow was noted to be functional.  Oral  prep and propulsion was WNL, Swallow reflex was timely, no  penetration, aspiration or post-swallow residue was seen.  Prior  to presentation of subsequent boluses, pt status was noted by SLP  and rad tech to change.  Pt became less interactive, and  exhibited a delay in responding to questions.  A slight tremor of  his head was noted , and pt reported feeling confused.  Pt was  apologetic, stating he didn't mean to get sick, and that he just  wanted to go home.  At that time, SLP and rad tech agreed that  MBS should be discontinued, and medical team should be contacted  to evaluate patient.  SLP stayed with the patient during RN/MD  evaluation.  Decision was made to cancel the MBS and change pt    status to NPO, pending further observation/tests/  SLP assisted  rad tech with taking pt  back to his room (per pt request).     Treatment Recommendation  Therapy as outlined in treatment plan below    Diet Recommendation NPO (per Dr. Reynolds. MBS results  inconclusive.)   Medication Administration: Via alternative means    Other  Recommendations Oral Care Recommendations: Oral care QID   Follow Up Recommendations       Frequency and Duration min 1 x/week  1 week   Pertinent Vitals/Pain Headache 7-8/10. RN informed     General Date of Onset: 06/08/13 HPI: Bradley Prince is a 52 y.o. male with basilar history of  hypertension and hyperlipidemia. Patient came in to the hospital  because of acute left-sided weakness. Patient was off of work  since June for the same above complaints. He works at [employer]. About 7:30 today he developed left-sided   weakness, numbness and severe headache. Headache is around his  left eye, patient also mentioned that his left eye was bleeding  with some blurring of vision as well. Denied any slurred speech  or facial asymmetry during SLP evaluation yesterday. MBS was  recommended to objectively assess swallow function and safety. Type of Study: Modified Barium Swallowing Study Reason for Referral: Objectively evaluate swallowing function Previous Swallow Assessment: SLE yesterday, during which pt  reported difficulty swallowing. MBS was recommended at that time. Diet Prior to this Study: Dysphagia 3 (soft);Nectar-thick liquids Temperature Spikes Noted: No Respiratory Status: Room air History of Recent Intubation: No Behavior/Cognition: Alert;Cooperative;Pleasant mood (pt reported  becoming confused after MBS initiated) Oral Cavity - Dentition: Adequate natural dentition Oral Motor / Sensory Function: Within functional limits Self-Feeding Abilities: Needs assist Patient Positioning: Upright in chair Baseline Vocal Quality: Clear Volitional Cough: Weak Volitional Swallow: Able to elicit Anatomy: Within functional limits Pharyngeal Secretions: Not observed secondary MBS    Reason for Referral Objectively evaluate swallowing function   Oral Phase Oral Preparation/Oral Phase Oral Phase: WFL (limited study - Only 1 bolus given)   Pharyngeal Phase Pharyngeal Phase Pharyngeal Phase: Within functional limits (limited study - only  1 bolus given)  Cervical Esophageal Phase    GO    Cervical Esophageal Phase Cervical Esophageal Phase: WFL (limited study - only 1 bolus  given)    Functional Assessment Tool Used: Clinical judgement Functional Limitations: Swallowing Swallow Current Status (G8996): At least 40 percent but less than  60 percent impaired, limited or restricted Swallow Goal Status (G8997): At least 1 percent but less than 20  percent impaired, limited or restricted   Celia B. Bueche, MSP, CCC-SLP [phone] Bueche, Celia  Brown 06/10/2013, 1:38 PM    Mr Mra Head/brain Wo Cm  06/09/2013   CLINICAL DATA:  Stroke. Left-sided weakness. Headache.  EXAM: MRI HEAD WITHOUT CONTRAST  MRA HEAD WITHOUT CONTRAST  TECHNIQUE: Multiplanar, multiecho pulse sequences of the brain and surrounding structures were obtained without intravenous contrast. Angiographic images of the head were obtained using MRA technique without contrast.  COMPARISON:  CT 06/08/2013  FINDINGS: MRI HEAD FINDINGS  Ventricle size is normal. Pituitary normal in size. Craniocervical junction normal.  Patchy hyperintensity in the cerebral white matter bilaterally. Confluent hyperintensity in the subcortical frontal white matter bilaterally. This is symmetric and slightly more pronounced on the left.  Negative for acute infarct. Negative for hemorrhage. No mass lesion is present.  MRA HEAD FINDINGS  Both vertebral arteries are patent to the basilar. PICA patent bilaterally.   Basilar is widely patent. AICA, superior cerebellar, and posterior cerebral arteries are all widely patent.  Right internal carotid artery widely patent. Right anterior and middle cerebral arteries are normal  Left internal carotid artery is widely patent. Left anterior and middle cerebral arteries are normal  Negative for cerebral aneurysm.  IMPRESSION: MRI HEAD IMPRESSION  Patchy and confluent areas of white matter hyperintensity bilaterally. Given the history of hypertension and hyperlipidemia and smoking, this is most likely all due to chronic microvascular ischemia. No acute infarct or mass.  MRA HEAD IMPRESSION  Negative intracranial MRA.   Electronically Signed   By: Charles  Clark M.D.   On: 06/09/2013 15:51    Medications: Scheduled Meds: . aspirin EC  81 mg Oral Daily  . atorvastatin  40 mg Oral Daily  . heparin  5,000 Units Subcutaneous Q8H  . influenza vac split quadrivalent PF  0.5 mL Intramuscular Tomorrow-1000  . lisinopril  20 mg Oral Daily  . pantoprazole  80 mg Oral Daily  .  varenicline  0.5 mg Oral BID  . verapamil  40 mg Oral BID   Continuous Infusions:  PRN Meds:.acetaminophen, diphenhydrAMINE, HYDROmorphone (DILAUDID) injection, LORazepam, ondansetron, promethazine    LOS: 2 days   Yannis Broce,CHRISTOPHER  [facility] Pager [phone]. If 8PM-8AM, please contact night-coverage at [email or web address], password [email or web address] 06/10/2013, 4:09 PM  LOS: 2 days     

## 2013-06-11 ENCOUNTER — Encounter (HOSPITAL_COMMUNITY): Payer: Self-pay | Admitting: *Deleted

## 2013-06-11 ENCOUNTER — Encounter (HOSPITAL_COMMUNITY)
Admission: EM | Disposition: A | Discharge status: Home / Self Care | Payer: BC Managed Care – PPO | Source: Home / Self Care | Attending: Internal Medicine

## 2013-06-11 DIAGNOSIS — F17200 Nicotine dependence, unspecified, uncomplicated: Secondary | ICD-10-CM

## 2013-06-11 DIAGNOSIS — I38 Endocarditis, valve unspecified: Secondary | ICD-10-CM

## 2013-06-11 HISTORY — PX: TEE WITHOUT CARDIOVERSION: SHX5443

## 2013-06-11 LAB — BASIC METABOLIC PANEL
CO2: 30 mEq/L (ref 19–32)
Calcium: 8.6 mg/dL (ref 8.4–10.5)
Chloride: 102 mEq/L (ref 96–112)
Glucose, Bld: 83 mg/dL (ref 70–99)
Sodium: 137 mEq/L (ref 135–145)

## 2013-06-11 LAB — TROPONIN I
Troponin I: <0.3 ng/mL (ref <0.30)
Troponin I: <0.3 ng/mL (ref <0.30)

## 2013-06-11 SURGERY — ECHOCARDIOGRAM, TRANSESOPHAGEAL
Anesthesia: Moderate Sedation

## 2013-06-11 MED ORDER — MORPHINE SULFATE 2 MG/ML IJ SOLN: 1.0000 mg | INTRAMUSCULAR | Status: DC | PRN

## 2013-06-11 MED ORDER — MIDAZOLAM HCL 5 MG/ML IJ SOLN
INTRAMUSCULAR | Status: AC
  Filled 2013-06-11: qty 2

## 2013-06-11 MED ORDER — SODIUM CHLORIDE 0.9 % IV SOLN
INTRAVENOUS | Status: DC
  Administered 2013-06-11: 15:00:00 via INTRAVENOUS

## 2013-06-11 MED ORDER — FENTANYL CITRATE 0.05 MG/ML IJ SOLN
INTRAMUSCULAR | Status: DC | PRN
  Administered 2013-06-11: 50 ug via INTRAVENOUS
  Administered 2013-06-11: 25 ug via INTRAVENOUS

## 2013-06-11 MED ORDER — BUTAMBEN-TETRACAINE-BENZOCAINE 2-2-14 % EX AERO
INHALATION_SPRAY | CUTANEOUS | Status: DC | PRN
  Administered 2013-06-11: 2 via TOPICAL

## 2013-06-11 MED ORDER — MIDAZOLAM HCL 10 MG/2ML IJ SOLN
INTRAMUSCULAR | Status: DC | PRN
  Administered 2013-06-11: 3 mg via INTRAVENOUS
  Administered 2013-06-11 (×2): 2 mg via INTRAVENOUS

## 2013-06-11 MED ORDER — HYDROMORPHONE HCL PF 1 MG/ML IJ SOLN
2.0000 mg | Freq: Once | INTRAMUSCULAR | Status: AC
  Administered 2013-06-11: 2 mg via INTRAVENOUS
  Filled 2013-06-11: qty 2

## 2013-06-11 MED ORDER — EZETIMIBE 10 MG PO TABS
10.0000 mg | ORAL_TABLET | Freq: Every day | ORAL | Status: DC
  Filled 2013-06-11 (×2): qty 1

## 2013-06-11 MED ORDER — FENTANYL CITRATE 0.05 MG/ML IJ SOLN
INTRAMUSCULAR | Status: AC
  Filled 2013-06-11: qty 4

## 2013-06-11 NOTE — Interval H&P Note (Signed)
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 After consideration of risks, benefits and other options for treatment, the patient has consented to  Procedure(s): TRANSESOPHAGEAL ECHOCARDIOGRAM (TEE) (N/A) as a surgical intervention .  The patient's history has been reviewed, patient examined, no change in status, stable for surgery.  I have reviewed the patient's chart and labs.  Questions were answered to the patient's satisfaction.     Elyn Aquas.

## 2013-06-11 NOTE — Progress Notes (Signed)
Stroke Team Progress Note     Review of Echo as below:  2D Echocardiogram - ejection fraction 55-60%. 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 (possibly calcified nodule) on the non-coronary cusp which appears adherent to the valve, but may be minimally mobile.   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 Patient can be followed by cardiology/IM for this finding.   Not felt to be related to neurological problem.   Stroke service to sign off  Have patient follow up with primary MD for risk factor management.   Gwendolyn Lima. Manson Passey, [facility], MBA, MHA [facility] Pager: [phone] 06/11/2013 2:11 PM  I have personally  evaluated imaging results, and formulated the assessment and plan of care. I agree with the above. Delia Heady, MD

## 2013-06-11 NOTE — Progress Notes (Signed)
Second attempt to complete f/u swallow evaluation to resume po's.  Pt. Is still in Endoscopy for TEE, and has been sedated for this.  Discussed with RN, and recommended RN complete the RN swallow screen and begin diet if all appears ok on screen.  All neurological tests have been negative, and pt. Has been NPO since yesterday when a code stroke was called during the MBS.    SLP will f/u 9/26 for f/u assessment of diet tolerance.  Bradley Prince T

## 2013-06-11 NOTE — Progress Notes (Signed)
Visited with patient per patient request.  Patient experiencing high levels of anxiety. Pt. Is afraid of dying and fearful of diagnosis. Helped pt. to access his hopes and reinforce his faith and truth in God. Prayed with patient and wife at bedside. Will follow up as needed.   06/11/13 1200  Clinical Encounter Type  Visited With Patient and family together;Health care provider  Visit Type Spiritual support;Pre-op  Referral From Patient  Recommendations (follow up visit)  Spiritual Encounters  Spiritual Needs Prayer;Emotional  Stress Factors  Patient Stress Factors Exhausted  Family Stress Factors None identified

## 2013-06-11 NOTE — Progress Notes (Signed)
[facility] PROGRESS NOTE  Bradley Prince ZOX:[id number] DOB: 1961-06-30 DOA: 06/08/2013 PCP: Marcell Anger, NP  Assessment/Plan: Principal Problem:   Left-sided weakness Active Problems:   Severe headache   Left sided numbness   Blurry vision   HTN (hypertension)   Nausea alone   Dyslipidemia   Smoker    1. Left-sided weakness/Headache: Patient presented with a left-sided weakness and severe headache. Dr Noel Christmas provided neurology consultation, and patient underwent CVA/TIA workup. Brain MRI/ MRA showed no evidence of acute or concerning findings, vascular duplex revealed 1-39% ICA stenosis bilaterally and antegrade vertebral artery flow. Headache responded to analgesics. Patient was subsequently seen by Dr Delia Heady, who feel that there may be a functional overlay, to patient's symptoms. Patient is on low dose ASA, and weakness has improved. PT evaluated and recommended outpatient PT, but OT felt CIR may be considered. Rehab MD consultation was accordingly requested and patient was deemed suitable for outpatient physiotherapy. Patient was unable to complete MBS on 06/10/13, and failed bedside swallow screen today. Still NPO.   2. Cervical DJD/Radiculopathy: MR of the C-spine showed shallow disc osteophyte complexes bilaterally with mild right foraminal stenosis possibly irritating the right C6 nerve root, small right paracentral disc protrusion at C6-7 and mild right foraminal stenosis. 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  2. Hypertension: Controlled on pre-admission antihypertensives.  3. Dyslipidemia: Patient was on Lipitor pre-admission, and claims compliance. Lipid profile revealed TC 210, TG 218, HDL 36, LDL 130. Target LDL should be <70. Zetia was added to treatment.  4. Tobacco User: The patient was counseled on the dangers of tobacco use, and was advised to quit. Reviewed strategies to maximize success, including removing cigarettes and smoking materials from  environment, stress management and support of family/friends. 5. Aortic valve mass: 2D Echocardiogram of 06/09/13, revealed a small 4 mm round, echogenic mass (possibly calcified nodule) on the non-coronary cusp which appears adherent to the valve, but may be minimally mobile. The significance is uncertain, and the neurology team do not feel that this is related to patient's presenting symptomatology. Dr Leodis Sias performed a TEE on 06/11/13, which showed  normal LV function, normal 3 leaflet aortic valve, a small calcified area on the noncoronary cusp of the aortic valve. No vegitation. No AS or AI. No left atrial or LA appendage thrombus and no PFO or ASD by color flow and bubble contrast. Patient has been reassured accordingly.  6. Oral thrush: An incidental finding on physical examination of 06/10/13. Managed with 7-day course of Diflucan, to be concluded on 06/16/13.     Code Status: Full Code.  Family Communication:  Disposition Plan: To be determined.    Brief narrative: 52 y.o. male with history of hypertension, TIA, AAA, s/p repair and hyperlipidemia. Patient came in to the hospital because of acute left-sided weakness. Patient was off of work since June for the same above complaints. He works at [employer]. At about 7:30 AM on 06/08/13, he developed left-sided weakness, numbness and severe headache. Headache was around his left eye, associated blurring of vision as well. Denied any slurred speech or facial asymmetry.  In the ED patient evaluated by neurology and workup for TIA, recommended.   Consultants:  Dr Noel Christmas, neurologist.   Procedures:  CXR.  Head CT scan.  Brain MRI/MRA.   MRI Cervical spine.  MBS.   Antibiotics:  N/A.   HPI/Subjective: No new issues.  Objective: Vital signs in last 24 hours: Temp:  [  98.1 F (36.7 C)-98.5 F (36.9 C)] 98.5 F (36.9 C) (09/25 1512) Pulse Rate:  [61-89] 81 (09/25 1640) Resp:  [13-22] 17 (09/25  1640) BP: (120-200)/(63-115) 136/92 mmHg (09/25 1640) SpO2:  [92 %-100 %] 93 % (09/25 1640) Weight change:  Last BM Date: 06/10/13  Intake/Output from previous day: 09/24 0701 - 09/25 0700 In: -  Out: 380 [Urine:380]     Physical Exam: General: Comfortable, alert, communicative, fully oriented, not short of breath at rest.  HEENT:  No clinical pallor, no jaundice, no conjunctival injection or discharge. Hydration status is satisfactory.  NECK:  Supple, JVP not seen, no carotid bruits, no palpable lymphadenopathy, no palpable goiter. CHEST:  Clinically clear to auscultation, no wheezes, no crackles. HEART:  Sounds 1 and 2 heard, normal, regular, no murmurs. ABDOMEN:  Full, soft, non-tender, no palpable organomegaly, no palpable masses, normal bowel sounds. GENITALIA:  Not examined. LOWER EXTREMITIES:  No pitting edema, palpable peripheral pulses. MUSCULOSKELETAL SYSTEM:  Unremarkable. CENTRAL NERVOUS SYSTEM:  No focal neurologic deficit on gross examination.  Lab Results: No results found for this basename: WBC, HGB, HCT, PLT,  in the last 72 hours  Recent Labs  06/11/13 0540  NA 137  K 4.1  CL 102  CO2 30  GLUCOSE 83  BUN 10  CREATININE 0.90  CALCIUM 8.6   No results found for this or any previous visit (from the past 240 hour(s)).   Studies/Results: Dg Swallowing Func-speech Pathology  06/10/2013   Gray Bernhardt, CCC-SLP     06/10/2013  1:39 PM Objective Swallowing Evaluation: Modified Barium Swallowing Study   Patient Details  Name: Bradley Prince MRN: [id number] Date of Birth: 1961-03-08  Today's Date: 06/10/2013 Time: 1010-1110 SLP Time Calculation (min): 60 min  Past Medical History:  Past Medical History  Diagnosis Date  . TIA (transient ischemic attack)   . Hypertension   . Hyperlipidemia    Past Surgical History:  Past Surgical History  Procedure Laterality Date  . Abdominal aortic aneurysm repair    . Cholecystectomy    . Appendectomy     HPI:  Bradley Prince is a  52 y.o. male with basilar history of  hypertension and hyperlipidemia. Patient came in to the hospital  because of acute left-sided weakness. Patient was off of work  since June for the same above complaints. He works at [employer]. About 7:30 today he developed left-sided  weakness, numbness and severe headache. Headache is around his  left eye, patient also mentioned that his left eye was bleeding  with some blurring of vision as well. Denied any slurred speech  or facial asymmetry during SLP evaluation yesterday. MBS was  recommended to objectively assess swallow function and safety.     Assessment / Plan / Recommendation Clinical Impression  Clinical impression: Pt was given one (1/2tsp) bolus of  puree/barium.  This one swallow was noted to be functional.  Oral  prep and propulsion was WNL, Swallow reflex was timely, no  penetration, aspiration or post-swallow residue was seen.  Prior  to presentation of subsequent boluses, pt status was noted by SLP  and rad tech to change.  Pt became less interactive, and  exhibited a delay in responding to questions.  A slight tremor of  his head was noted , and pt reported feeling confused.  Pt was  apologetic, stating he didn't mean to get sick, and that he just  wanted to go home.  At that time, SLP and rad  tech agreed that  MBS should be discontinued, and medical team should be contacted  to evaluate patient.  SLP stayed with the patient during RN/MD  evaluation.  Decision was made to cancel the MBS and change pt  status to NPO, pending further observation/tests/  SLP assisted  rad tech with taking pt  back to his room (per pt request).     Treatment Recommendation  Therapy as outlined in treatment plan below    Diet Recommendation NPO (per Dr. Thad Ranger. MBS results  inconclusive.)   Medication Administration: Via alternative means    Other  Recommendations Oral Care Recommendations: Oral care QID   Follow Up Recommendations       Frequency and  Duration min 1 x/week  1 week   Pertinent Vitals/Pain Headache 7-8/10. RN informed     General Date of Onset: 06/08/13 HPI: Bradley Prince is a 52 y.o. male with basilar history of  hypertension and hyperlipidemia. Patient came in to the hospital  because of acute left-sided weakness. Patient was off of work  since June for the same above complaints. He works at [employer]. About 7:30 today he developed left-sided  weakness, numbness and severe headache. Headache is around his  left eye, patient also mentioned that his left eye was bleeding  with some blurring of vision as well. Denied any slurred speech  or facial asymmetry during SLP evaluation yesterday. MBS was  recommended to objectively assess swallow function and safety. Type of Study: Modified Barium Swallowing Study Reason for Referral: Objectively evaluate swallowing function Previous Swallow Assessment: SLE yesterday, during which pt  reported difficulty swallowing. MBS was recommended at that time. Diet Prior to this Study: Dysphagia 3 (soft);Nectar-thick liquids Temperature Spikes Noted: No Respiratory Status: Room air History of Recent Intubation: No Behavior/Cognition: Alert;Cooperative;Pleasant mood (pt reported  becoming confused after MBS initiated) Oral Cavity - Dentition: Adequate natural dentition Oral Motor / Sensory Function: Within functional limits Self-Feeding Abilities: Needs assist Patient Positioning: Upright in chair Baseline Vocal Quality: Clear Volitional Cough: Weak Volitional Swallow: Able to elicit Anatomy: Within functional limits Pharyngeal Secretions: Not observed secondary MBS    Reason for Referral Objectively evaluate swallowing function   Oral Phase Oral Preparation/Oral Phase Oral Phase: WFL (limited study - Only 1 bolus given)   Pharyngeal Phase Pharyngeal Phase Pharyngeal Phase: Within functional limits (limited study - only  1 bolus given)  Cervical Esophageal Phase    GO    Cervical Esophageal Phase  Cervical Esophageal Phase: WFL (limited study - only 1 bolus  given)    Functional Assessment Tool Used: Clinical judgement Functional Limitations: Swallowing Swallow Current Status ([id number]): At least 40 percent but less than  60 percent impaired, limited or restricted Swallow Goal Status [phone]): At least 1 percent but less than 20  percent impaired, limited or restricted   [name] Natal [facility], CCC-SLP [phone] Bradley Prince 06/10/2013, 1:38 PM     Medications: Scheduled Meds: . [facility] HOLD] aspirin EC  81 mg Oral Daily  . [facility] HOLD] atorvastatin  40 mg Oral Daily  . [[facility] HOLD] fluconazole (DIFLUCAN) IV  100 mg Intravenous Q24H  . [MAR HOLD] heparin  5,000 Units Subcutaneous Q8H  . [[facility] HOLD] lisinopril  20 mg Oral Daily  . [facility] HOLD] pantoprazole  80 mg Oral Daily  . [facility] HOLD] varenicline  0.5 mg Oral BID  . [facility] HOLD] verapamil  40 mg Oral BID   Continuous Infusions: . sodium chloride 20 mL/hr  at 06/11/13 1518  . sodium chloride 75 mL/hr at 06/11/13 0621   PRN Meds:.[[facility] HOLD] acetaminophen, [MAR HOLD] diphenhydrAMINE, [MAR HOLD]  HYDROmorphone (DILAUDID) injection, [MAR HOLD] LORazepam, [MAR HOLD] ondansetron, [MAR HOLD] promethazine    LOS: 3 days   Deannie Resetar,CHRISTOPHER  [facility] Pager [phone]. If 8PM-8AM, please contact night-coverage at [email or web address], password [facility] 06/11/2013, 5:16 PM  LOS: 3 days

## 2013-06-11 NOTE — CV Procedure (Signed)
    Transesophageal Echocardiogram Note  Bradley Prince [id number] Apr 27, 1961  Procedure: Transesophageal Echocardiogram Indications: calcified mass on aortic valve  Procedure Details Consent: Obtained Time Out: Verified patient identification, verified procedure, site/side was marked, verified correct patient position, special equipment/implants available, Radiology Safety Procedures followed,  medications/allergies/relevent history reviewed, required imaging and test results available.  Performed  Medications: Fentanyl: 75 mcg IV Versed: 7 mg IV  Left Ventrical:  Normal LV function  Mitral Valve: normal MV  Aortic Valve: normal 3 leaflet aortic  Valve.  The is a small calcified area on the noncoronary cusp of the aortic valve.  No vegitation.  No AS or AI  Tricuspid Valve: trace TR  Pulmonic Valve: normal PV  Left Atrium/ Left atrial appendage: no thrombus,  Atrial septum: no PFO or ASD by color flow and bubble contrast  Aorta: normal   Complications: No apparent complications Patient did tolerate procedure well.   Bradley Prince, Bradley Prince., MD, [facility] 06/11/2013, 4:26 PM

## 2013-06-11 NOTE — Progress Notes (Signed)
Shift event: RN paged this NP earlier secondary to pt c/o chest pain and left arm pain. VSS. O2 was applied for comfort. Pt on 81mg  ASA a day. He denied any SOB, diaphoresis, n/v, abd pain or back pain. 12 lead EKG ordered along with cyclical troponins.  NP to bedside at 0122 (was in a code at time of call).  S: Pt states he is "not really" having any chest pain now. About 1/10. States the pain woke him and was 10/10 at that time described as crushing. Located mid sternum and some left arm pain. Only lasted a few minutes. Denies any SOB, diaphoresis, jaw pain, back pain or arm pain now. No n/v. He is worried about the TEE he is to have this am and RN gave him pain med and Ativan which helped. Denies hx of heart problems. No stents in heart of CABG. No MI in past that he is aware. Did have aortic aneurysm repair years ago.  O: Appears well. Alert and oriented. In no distress. RRR. Normal resp effort. VSS.  A/P: chest pain, acute-resolved quickly. EKG reviewed and is without changes as compared with previous EKGs. Will await troponins, but I do not think this mimics ACS. May d/c O2 or wear for comfort while sleeping tonight. Perhaps, it is anxiety about the TEE.  Will cont to monitor and follow labs. Jimmye Norman, NP [facility]

## 2013-06-11 NOTE — H&P (View-Only) (Signed)
[facility] PROGRESS NOTE  Bradley Prince ZOX:[id number] DOB: 1961-06-22 DOA: 06/08/2013 PCP: Marcell Anger, NP  Assessment/Plan: Principal Problem:   Left-sided weakness Active Problems:   Severe headache   Left sided numbness   Blurry vision   HTN (hypertension)   Nausea alone   Dyslipidemia   Smoker    1. Left-sided weakness/Headache: Patient presented with a left-sided weakness and severe headache. Dr Noel Christmas provided neurology consultation, and patient underwent CVA/TIA workup. Brain MRI/ MRA showed no evidence of acute or concerning findings, vascular duplex revealed 1-39% ICA stenosis bilaterally and antegrade vertebral artery flow. Headache responded to analgesics. Patient was subsequently seen by Dr Delia Heady, who feel that there may be a functional overlay, to patient's symptoms. Patient is on low dose ASA, and weakness has improved. PT evaluated and recommended outpatient PT, but OT feels CIR may be considered. Will consult rehab MD. Patient was unable to complete MBS today. Although he is recommended D3 diet/Thin. NPO today.  2. Cervical DJD/Radiculopathry: MR of the C-spine showed shallow disc osteophyte complexes bilaterally with mild right foraminal stenosis possibly irritating the right C6 nerve root, small right paracentral disc protrusion at C6-7 and mild right foraminal stenosis. 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  2. Hypertension: Controlled on pre-admission antihypertensives.  3. Dyslipidemia: Patient was on Lipitor pre-admission, and claims compliance. Lipid profile revealed TC 210, TG 218, HDL 36, LDL 130. Target LDL should be <70. Have added Zetia to treatment.  4. Tobacco User: The patient was counseled on the dangers of tobacco use, and was advised to quit. Reviewed strategies to maximize success, including removing cigarettes and smoking materials from environment, stress management and support of family/friends. 5. Aortic valve mass: 2D  Echocardiogram of 06/09/13, revealed a small 4 mm round, echogenic mass (possibly calcified nodule) on the non-coronary cusp which appears adherent to the valve, but may be minimally mobile. The significance is uncertain, and the neurology team do not feel that this is related to patient's presenting symptomatology. Have consulted [facility] for TEE. This will likely be done on 06/11/13.  6. Oral thrush: An incidental finding on physical examination of 06/10/13. Will manage with 7-day course of Diflucan.     Code Status: Full Code.  Family Communication:  Disposition Plan: To be determined.    Brief narrative: 52 y.o. male with history of hypertension, TIA, AAA, s/p repair and hyperlipidemia. Patient came in to the hospital because of acute left-sided weakness. Patient was off of work since June for the same above complaints. He works at [employer]. At about 7:30 AM on 06/08/13, he developed left-sided weakness, numbness and severe headache. Headache was around his left eye, associated blurring of vision as well. Denied any slurred speech or facial asymmetry.  In the ED patient evaluated by neurology and workup for TIA, recommended.   Consultants:  Dr Noel Christmas, neurologist.   Procedures:  CXR.  Head CT scan.  Brain MRI/MRA.   MRI Cervical spine.  MBS.   Antibiotics:  N/A.   HPI/Subjective: No new issues. Was unable to tolerate MBS today. Currently NPO.   Objective: Vital signs in last 24 hours: Temp:  [97.8 F (36.6 C)-98.3 F (36.8 C)] 98.3 F (36.8 C) (09/24 1408) Pulse Rate:  [57-79] 78 (09/24 1408) Resp:  [18] 18 (09/24 1408) BP: (107-148)/(63-89) 148/89 mmHg (09/24 1408) SpO2:  [94 %-100 %] 97 % (09/24 1408) Weight change:  Last BM Date: 06/08/13  Intake/Output from previous day: 09/23  0701 - 09/24 0700 In: 120 [P.O.:120] Out: 175 [Urine:175]     Physical Exam: General: Comfortable, alert, communicative, fully oriented, not  short of breath at rest.  HEENT:  No clinical pallor, no jaundice, no conjunctival injection or discharge. Hydration status is satisfactory.  NECK:  Supple, JVP not seen, no carotid bruits, no palpable lymphadenopathy, no palpable goiter. CHEST:  Clinically clear to auscultation, no wheezes, no crackles. HEART:  Sounds 1 and 2 heard, normal, regular, no murmurs. ABDOMEN:  Full, soft, non-tender, no palpable organomegaly, no palpable masses, normal bowel sounds. GENITALIA:  Not examined. LOWER EXTREMITIES:  No pitting edema, palpable peripheral pulses. MUSCULOSKELETAL SYSTEM:  Unremarkable. CENTRAL NERVOUS SYSTEM:  No focal neurologic deficit on gross examination.  Lab Results:  Recent Labs  06/08/13 1100 06/08/13 1116  WBC 7.5  --   HGB 15.9 16.0  HCT 45.7 47.0  PLT 315  --     Recent Labs  06/08/13 1100 06/08/13 1116  NA 136 140  K 3.9 4.0  CL 101 105  CO2 26  --   GLUCOSE 100* 100*  BUN 9 8  CREATININE 0.87 0.90  CALCIUM 9.6  --    No results found for this or any previous visit (from the past 240 hour(s)).   Studies/Results: Dg Chest 2 View  06/08/2013   CLINICAL DATA:  Stroke. Hypertension. Hyperlipidemia.  EXAM: CHEST  2 VIEW  COMPARISON:  None.  FINDINGS: Lateral view degraded by patient arm position. Prior median sternotomy. Patient rotated to the left on the frontal film. Cardiomegaly. Mildly low lung volumes. No pleural effusion or pneumothorax. Clear lungs.  IMPRESSION: Cardiomegaly and low lung volumes, without acute disease.   Electronically Signed   By: Jeronimo Greaves   On: 06/08/2013 20:02   Mr Brain Wo Contrast  06/09/2013   CLINICAL DATA:  Stroke. Left-sided weakness. Headache.  EXAM: MRI HEAD WITHOUT CONTRAST  MRA HEAD WITHOUT CONTRAST  TECHNIQUE: Multiplanar, multiecho pulse sequences of the brain and surrounding structures were obtained without intravenous contrast. Angiographic images of the head were obtained using MRA technique without contrast.   COMPARISON:  CT 06/08/2013  FINDINGS: MRI HEAD FINDINGS  Ventricle size is normal. Pituitary normal in size. Craniocervical junction normal.  Patchy hyperintensity in the cerebral white matter bilaterally. Confluent hyperintensity in the subcortical frontal white matter bilaterally. This is symmetric and slightly more pronounced on the left.  Negative for acute infarct. Negative for hemorrhage. No mass lesion is present.  MRA HEAD FINDINGS  Both vertebral arteries are patent to the basilar. PICA patent bilaterally. Basilar is widely patent. AICA, superior cerebellar, and posterior cerebral arteries are all widely patent.  Right internal carotid artery widely patent. Right anterior and middle cerebral arteries are normal  Left internal carotid artery is widely patent. Left anterior and middle cerebral arteries are normal  Negative for cerebral aneurysm.  IMPRESSION: MRI HEAD IMPRESSION  Patchy and confluent areas of white matter hyperintensity bilaterally. Given the history of hypertension and hyperlipidemia and smoking, this is most likely all due to chronic microvascular ischemia. No acute infarct or mass.  MRA HEAD IMPRESSION  Negative intracranial MRA.   Electronically Signed   By: Marlan Palau M.D.   On: 06/09/2013 15:51   Mr Cervical Spine Wo Contrast  06/09/2013   CLINICAL DATA:  Neck and left arm pain.  EXAM: MRI CERVICAL SPINE WITHOUT CONTRAST  TECHNIQUE: Multiplanar, multisequence MR imaging was performed. No intravenous contrast was administered.  COMPARISON:  None  FINDINGS: The  cervical vertebral bodies are normally aligned and demonstrate normal marrow signal. The cervical spinal cord demonstrates a small focal syrinx at C7. No cord lesions.  Mild to moderate facet disease is noted. No abnormal STIR signal intensity in the paraspinal muscles or posterior elements.  C2-3: No significant findings.  C3-4:  No significant findings.  C4-5:  No significant findings.  C5-6 central and slightly left  paracentral disc protrusion with mild mass effect on the left side of the thecal sac. Shallow disc osteophyte complexes bilaterally with mild right foraminal stenosis possibly irritating the right C6 nerve root.  C6-7: Small right paracentral disc protrusion with mild focal mass effect on the ventral thecal sac and narrowing of the ventral CSF space. Shallow disc osteophyte complexes bilaterally with mild right-sided foraminal stenosis.  C7-T1:  No significant findings.  IMPRESSION: 1. Central and slightly left paracentral disc protrusion at C5-6. Shallow disc osteophyte complexes bilaterally with mild right foraminal stenosis possibly irritating the right C6 nerve root. 2. Small right paracentral disc protrusion at C6-7 and mild right foraminal stenosis. 3. 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   Electronically Signed   By: Loralie Champagne M.D.   On: 06/09/2013 16:06   Dg Swallowing Func-speech Pathology  06/10/2013   Gray Bernhardt, CCC-SLP     06/10/2013  1:39 PM Objective Swallowing Evaluation: Modified Barium Swallowing Study   Patient Details  Name: Bradley Prince MRN: [id number] Date of Birth: Sep 16, 1961  Today's Date: 06/10/2013 Time: 1010-1110 SLP Time Calculation (min): 60 min  Past Medical History:  Past Medical History  Diagnosis Date  . TIA (transient ischemic attack)   . Hypertension   . Hyperlipidemia    Past Surgical History:  Past Surgical History  Procedure Laterality Date  . Abdominal aortic aneurysm repair    . Cholecystectomy    . Appendectomy     HPI:  Bradley Prince is a 52 y.o. male with basilar history of  hypertension and hyperlipidemia. Patient came in to the hospital  because of acute left-sided weakness. Patient was off of work  since June for the same above complaints. He works at [employer]. About 7:30 today he developed left-sided  weakness, numbness and severe headache. Headache is around his  left eye, patient also mentioned that his left eye was bleeding  with some  blurring of vision as well. Denied any slurred speech  or facial asymmetry during SLP evaluation yesterday. MBS was  recommended to objectively assess swallow function and safety.     Assessment / Plan / Recommendation Clinical Impression  Clinical impression: Pt was given one (1/2tsp) bolus of  puree/barium.  This one swallow was noted to be functional.  Oral  prep and propulsion was WNL, Swallow reflex was timely, no  penetration, aspiration or post-swallow residue was seen.  Prior  to presentation of subsequent boluses, pt status was noted by SLP  and rad tech to change.  Pt became less interactive, and  exhibited a delay in responding to questions.  A slight tremor of  his head was noted , and pt reported feeling confused.  Pt was  apologetic, stating he didn't mean to get sick, and that he just  wanted to go home.  At that time, SLP and rad tech agreed that  MBS should be discontinued, and medical team should be contacted  to evaluate patient.  SLP stayed with the patient during RN/MD  evaluation.  Decision was made to cancel the MBS and change pt  status to NPO, pending further observation/tests/  SLP assisted  rad tech with taking pt  back to his room (per pt request).     Treatment Recommendation  Therapy as outlined in treatment plan below    Diet Recommendation NPO (per Dr. Thad Ranger. MBS results  inconclusive.)   Medication Administration: Via alternative means    Other  Recommendations Oral Care Recommendations: Oral care QID   Follow Up Recommendations       Frequency and Duration min 1 x/week  1 week   Pertinent Vitals/Pain Headache 7-8/10. RN informed     General Date of Onset: 06/08/13 HPI: Bradley Prince is a 52 y.o. male with basilar history of  hypertension and hyperlipidemia. Patient came in to the hospital  because of acute left-sided weakness. Patient was off of work  since June for the same above complaints. He works at [employer]. About 7:30 today he developed left-sided   weakness, numbness and severe headache. Headache is around his  left eye, patient also mentioned that his left eye was bleeding  with some blurring of vision as well. Denied any slurred speech  or facial asymmetry during SLP evaluation yesterday. MBS was  recommended to objectively assess swallow function and safety. Type of Study: Modified Barium Swallowing Study Reason for Referral: Objectively evaluate swallowing function Previous Swallow Assessment: SLE yesterday, during which pt  reported difficulty swallowing. MBS was recommended at that time. Diet Prior to this Study: Dysphagia 3 (soft);Nectar-thick liquids Temperature Spikes Noted: No Respiratory Status: Room air History of Recent Intubation: No Behavior/Cognition: Alert;Cooperative;Pleasant mood (pt reported  becoming confused after MBS initiated) Oral Cavity - Dentition: Adequate natural dentition Oral Motor / Sensory Function: Within functional limits Self-Feeding Abilities: Needs assist Patient Positioning: Upright in chair Baseline Vocal Quality: Clear Volitional Cough: Weak Volitional Swallow: Able to elicit Anatomy: Within functional limits Pharyngeal Secretions: Not observed secondary MBS    Reason for Referral Objectively evaluate swallowing function   Oral Phase Oral Preparation/Oral Phase Oral Phase: WFL (limited study - Only 1 bolus given)   Pharyngeal Phase Pharyngeal Phase Pharyngeal Phase: Within functional limits (limited study - only  1 bolus given)  Cervical Esophageal Phase    GO    Cervical Esophageal Phase Cervical Esophageal Phase: WFL (limited study - only 1 bolus  given)    Functional Assessment Tool Used: Clinical judgement Functional Limitations: Swallowing Swallow Current Status (F6213): At least 40 percent but less than  60 percent impaired, limited or restricted Swallow Goal Status [phone]): At least 1 percent but less than 20  percent impaired, limited or restricted   [name] [facility], CCC-SLP [phone] [name] 06/10/2013, 1:38 PM    Mr Bradley Prince Head/brain Wo Cm  06/09/2013   CLINICAL DATA:  Stroke. Left-sided weakness. Headache.  EXAM: MRI HEAD WITHOUT CONTRAST  MRA HEAD WITHOUT CONTRAST  TECHNIQUE: Multiplanar, multiecho pulse sequences of the brain and surrounding structures were obtained without intravenous contrast. Angiographic images of the head were obtained using MRA technique without contrast.  COMPARISON:  CT 06/08/2013  FINDINGS: MRI HEAD FINDINGS  Ventricle size is normal. Pituitary normal in size. Craniocervical junction normal.  Patchy hyperintensity in the cerebral white matter bilaterally. Confluent hyperintensity in the subcortical frontal white matter bilaterally. This is symmetric and slightly more pronounced on the left.  Negative for acute infarct. Negative for hemorrhage. No mass lesion is present.  MRA HEAD FINDINGS  Both vertebral arteries are patent to the basilar. PICA patent bilaterally.  Basilar is widely patent. AICA, superior cerebellar, and posterior cerebral arteries are all widely patent.  Right internal carotid artery widely patent. Right anterior and middle cerebral arteries are normal  Left internal carotid artery is widely patent. Left anterior and middle cerebral arteries are normal  Negative for cerebral aneurysm.  IMPRESSION: MRI HEAD IMPRESSION  Patchy and confluent areas of white matter hyperintensity bilaterally. Given the history of hypertension and hyperlipidemia and smoking, this is most likely all due to chronic microvascular ischemia. No acute infarct or mass.  MRA HEAD IMPRESSION  Negative intracranial MRA.   Electronically Signed   By: Marlan Palau M.D.   On: 06/09/2013 15:51    Medications: Scheduled Meds: . aspirin EC  81 mg Oral Daily  . atorvastatin  40 mg Oral Daily  . heparin  5,000 Units Subcutaneous Q8H  . influenza vac split quadrivalent PF  0.5 mL Intramuscular Tomorrow-1000  . lisinopril  20 mg Oral Daily  . pantoprazole  80 mg Oral Daily  .  varenicline  0.5 mg Oral BID  . verapamil  40 mg Oral BID   Continuous Infusions:  PRN Meds:.acetaminophen, diphenhydrAMINE, HYDROmorphone (DILAUDID) injection, LORazepam, ondansetron, promethazine    LOS: 2 days   Million Maharaj,CHRISTOPHER  [facility] Pager [phone]. If 8PM-8AM, please contact night-coverage at [email or web address], password [facility] 06/10/2013, 4:09 PM  LOS: 2 days

## 2013-06-11 NOTE — Progress Notes (Signed)
Speech asked RN to perform bed side swallow screen test when pt returned from procedure and to discontinue NPO if passed swallow screen. Pt was coughing through out procedure and not sure if it's the test that's causing pt to cough or contribution of a cold which pt spouse says he's now getting over it and that he's had the cough for a while. Pt failed on the bedside swallow screen and speech therapy evaluation ordered and pt remains NPO. Incoming RN notified. Rodell Perna Laverne Klugh RN.

## 2013-06-11 NOTE — Progress Notes (Signed)
Physical Therapy Treatment Patient Details Name: Bradley Prince MRN: [id number] DOB: [birth year] Today's Date: 06/11/2013 Time: 5621-3086 PT Time Calculation (min): 32 min  PT Assessment / Plan / Recommendation  History of Present Illness 52 y.o. male admitted to [facility] on 06/08/13 with left sided weakness and severe HA.  Stroke workup pending.  CT (-).     PT Comments   The pt is progressing well with his mobility.  He continues to have significant foot drag on his left, but not really any significant knee instability which I find odd.  I would expect more knee hyper extension and buckling based on the amount of foot drag he has and that he cannot lift his leg to move it off of the bed, he has to drag it over.  His wife was helpful during the session and even assisted pt into the bathroom.  She was present for education on going up and down stairs.  He was denied by inpatient rehab and for that reason continues to be appropriate for OP PT at discharge.    Follow Up Recommendations  Outpatient PT;Supervision/Assistance - 24 hour     Does the patient have the potential to tolerate intense rehabilitation    Yes  Barriers to Discharge   None      Equipment Recommendations  Rolling walker with 5" wheels    Recommendations for Other Services   None  Frequency Min 4X/week   Progress towards PT Goals Progress towards PT goals: Progressing toward goals  Plan Discharge plan needs to be updated    Precautions / Restrictions Precautions Precautions: Fall Precaution Comments: left hemiperesis   Pertinent Vitals/Pain See vitals flow sheet.     Mobility  Bed Mobility Bed Mobility: Supine to Sit;Sitting - Scoot to Edge of Bed;Sit to Supine Supine to Sit: 6: Modified independent (Device/Increase time);With rails;HOB elevated Sitting - Scoot to Edge of Bed: 6: Modified independent (Device/Increase time);With rail Sit to Supine: 6: Modified independent (Device/Increase time);With rail Details for  Bed Mobility Assistance: pt relying heavily on railing for support and leverage to get to EOB.  He struggled and required increased time to get to sitting.  He used his hands and his strong foot to progress his weak leg into and out of bed.   Transfers Transfers: Sit to Stand;Stand to Sit Sit to Stand: 4: Min assist;With upper extremity assist;With armrests;From bed Stand to Sit: 4: Min assist;With upper extremity assist;With armrests;To bed Details for Transfer Assistance: min assist to support trunk for balance and help control descent when going to sitting.   Ambulation/Gait Ambulation/Gait Assistance: 4: Min assist;3: Mod assist Ambulation Distance (Feet): 100 Feet Assistive device: Rolling walker Ambulation/Gait Assistance Details: up to mod assist for balance as pt's left leg started to fatigue.  He has intersting presentation in the fact that he has significant foot drag, but has enough quad strength to not buckle or hyperextend at his left knee.  His gait does not match his functional muscle strength. Verbal cues for safe use of RW.   Gait Pattern: Left steppage;Lateral trunk lean to right;Left flexed knee in stance;Narrow base of support;Decreased dorsiflexion - left;Decreased stance time - left;Decreased step length - left;Step-to pattern Gait velocity: slow General Gait Details: Pt had another nausea episode at the end of our walk today.  He is NPO for TEE and did not end up throwing up.   Stairs: Yes Stairs Assistance: 4: Min assist Stairs Assistance Details (indicate cue type and reason): min assist to support  trunk over weak leg and for balance.  Verbal cues for safe leg sequence.   Stair Management Technique: Two rails;Step to pattern;Forwards Number of Stairs: 5 Modified Rankin (Stroke Patients Only) Pre-Morbid Rankin Score: Slight disability Modified Rankin: Moderately severe disability      PT Goals (current goals can now be found in the care plan section) Acute Rehab PT  Goals Patient Stated Goal: to walk again  Visit Information  Last PT Received On: 06/11/13 Assistance Needed: +1 History of Present Illness: 52 y.o. male admitted to [facility] on 06/08/13 with left sided weakness and severe HA.  Stroke workup pending.  CT (-).      Subjective Data  Subjective: Pt sleeping upon arrival.  He reports being anxious about the TEE.   Patient Stated Goal: to walk again   Cognition  Cognition Arousal/Alertness: Awake/alert Behavior During Therapy: WFL for tasks assessed/performed Overall Cognitive Status: Impaired/Different from baseline Area of Impairment: Safety/judgement Memory: Decreased short-term memory Safety/Judgement: Decreased awareness of safety General Comments: pt slow processing, increased response time    Balance  Static Sitting Balance Static Sitting - Balance Support: No upper extremity supported;Feet supported Static Sitting - Level of Assistance: 5: Stand by assistance Static Standing Balance Static Standing - Balance Support: Bilateral upper extremity supported Static Standing - Level of Assistance: 4: Min assist;3: Mod assist Static Standing - Comment/# of Minutes: with RW Dynamic Standing Balance Dynamic Standing - Balance Support: Bilateral upper extremity supported Dynamic Standing - Level of Assistance: 3: Mod assist;4: Min assist Dynamic Standing - Comments: up to mod assist with dynamic activities for safey and LOB  End of Session PT - End of Session Equipment Utilized During Treatment: Gait belt Activity Tolerance: Patient limited by fatigue Patient left: in bed;with call bell/phone within reach;with family/visitor present;with nursing/sitter in room    Peconic B. Cigi Bega, PT, DPT [phone]   06/11/2013, 3:47 PM

## 2013-06-11 NOTE — Progress Notes (Signed)
Attempted to see pt. For repeat swallow evaluation to initiate diet, however, pt. Is NPO for TEE.  Pt. C/o hunger, as he has been NPO since MBS attempt 9/24 when a code stroke was called.   RN to call SLP when pt. Returns from TEE (hopefully prior to end of SLP's shift).  Maryjo Rochester T

## 2013-06-11 NOTE — Consult Note (Signed)
Physical Medicine and Rehabilitation Consult Reason for Consult: Suspect CVA Referring Physician: [facility]   HPI: Bradley Prince is a 52 y.o. he and his male with history of hypertension, hyperlipidemia,AAA repair and tobacco abuse. Admitted 06/08/2013 of left-sided weakness and severe headache. Patient has received workup in the past at [facility] for persistent headaches. Cranial CT scan MRI and negative for acute infarction. MRA of the head negative. Echocardiogram with ejection fraction of 60% no wall motion abnormalities grade 1 diastolic dysfunction. Carotid Dopplers with no ICA stenosis. Patient did not receive TPA. Neurology followup with workup ongoing maintained on aspirin therapy as well as subcutaneous heparin for DVT prophylaxis. TEE is pending. Physical occupational therapy evaluations completed and ongoing recommendations of physical medicine rehabilitation consult to consider inpatient rehabilitation services.   Review of Systems  Gastrointestinal: Positive for constipation.  Neurological: Positive for dizziness, tingling and headaches.  All other systems reviewed and are negative.   Past Medical History  Diagnosis Date  . TIA (transient ischemic attack)   . Hypertension   . Hyperlipidemia    Past Surgical History  Procedure Laterality Date  . Abdominal aortic aneurysm repair    . Cholecystectomy    . Appendectomy     History reviewed. No pertinent family history. Social History:  reports that he has been smoking Cigarettes.  He has been smoking about 0.50 packs per day. He does not have any smokeless tobacco history on file. He reports that he does not drink alcohol or use illicit drugs. Allergies:  Allergies  Allergen Reactions  . Penicillins Itching  . Toradol [Ketorolac Tromethamine] Swelling    Makes my throat swell    Medications Prior to Admission  Medication Sig Dispense Refill  . aspirin EC 81 MG tablet Take 81 mg by mouth daily.      Marland Kitchen atorvastatin  (LIPITOR) 40 MG tablet Take 40 mg by mouth daily.      . indomethacin (INDOCIN) 25 MG capsule Take 25 mg by mouth daily as needed (for headaches).      Marland Kitchen lisinopril (PRINIVIL,ZESTRIL) 20 MG tablet Take 20 mg by mouth daily.      Marland Kitchen omeprazole (PRILOSEC) 40 MG capsule Take 40 mg by mouth daily as needed (for acid reflux).       . ondansetron (ZOFRAN) 4 MG tablet Take 4 mg by mouth every 8 (eight) hours as needed for nausea.      . varenicline (CHANTIX) 0.5 MG tablet Take 0.5 mg by mouth 2 (two) times daily.      . verapamil (CALAN) 40 MG tablet Take 40 mg by mouth 2 (two) times daily.        Home: Home Living Family/patient expects to be discharged to:: Private residence Living Arrangements: Spouse/significant other Available Help at Discharge: Family;Available PRN/intermittently Type of Home: House Home Layout: One level;Other (Comment) (one level ranch with basement) Home Equipment: Walker - 2 wheels;Cane - single point;Shower seat Additional Comments: pt walked with cane PTA  Functional History: Prior Function Comments: drives and works at [employer] in [place] Status:  Mobility: Bed Mobility Bed Mobility: Supine to Sit;Sitting - Scoot to Edge of Bed;Sit to Supine Supine to Sit: 4: Min assist Sitting - Scoot to Edge of Bed: 4: Min assist Sit to Supine: 4: Min guard Transfers Transfers: Sit to Stand;Stand to Sit Sit to Stand: 4: Min assist;With upper extremity assist Stand to Sit: With upper extremity assist;To chair/3-in-1;3: Mod assist Ambulation/Gait Ambulation/Gait Assistance: 3: Mod assist Ambulation Distance (Feet): 40 Feet  Assistive device: Rolling walker Ambulation/Gait Assistance Details: max v/c's to maintain eye opening, max v/c's to pick up Left foot to clear foot, assist pt with L hand on walker Gait Pattern: Left steppage;Lateral trunk lean to right;Left flexed knee in stance;Narrow base of support;Decreased dorsiflexion - left;Decreased stance time -  left;Decreased step length - left;Step-to pattern Gait velocity: slow General Gait Details: pt with episode of feeling nausea/dizzy, saying "I feel like i'm going to pass out." Pt required to sit immeadiately. Mild tremor noted and head shaking. Pt reports "I'm so sorry I couldn't walk further. I really wanted to do good today." Stairs: No    ADL: ADL Grooming: Teeth care;Min guard Where Assessed - Grooming: Supported standing Upper Body Bathing: Minimal assistance Where Assessed - Upper Body Bathing: Unsupported sitting (sitting EOB; back unsupported but UE may have been supported) Lower Body Bathing: Minimal assistance Where Assessed - Lower Body Bathing: Supported sit to stand Upper Body Dressing: Minimal assistance Where Assessed - Upper Body Dressing: Unsupported sitting (sitting EOB; back unsupported but UE may have been supported) Lower Body Dressing: Minimal assistance Where Assessed - Lower Body Dressing: Supported sit to stand Toilet Transfer: Hydrographic surveyor Method: Sit to Barista: Comfort height toilet Tub/Shower Transfer Method: Not assessed Equipment Used: Gait belt;Rolling walker Transfers/Ambulation Related to ADLs: Min guard-cues for positioning of Lt hand on walker and to avoid grabbing furniture. Min guard for transfers. ADL Comments: Pt ambulated in hallway and also in bathroom to practice toilet transfer and perform teeth care. Pt coughing during teeth care task and OT educated to not swallow water-nurse notified of coughing.   Cognition: Cognition Overall Cognitive Status: Impaired/Different from baseline Arousal/Alertness: Awake/alert Orientation Level: Oriented X4 Attention: Divided Divided Attention: Appears intact Memory: Appears intact Awareness: Appears intact Problem Solving: Appears intact Safety/Judgment: Appears intact Cognition Arousal/Alertness: Awake/alert Behavior During Therapy: WFL for tasks  assessed/performed Overall Cognitive Status: Impaired/Different from baseline Area of Impairment: Orientation;Safety/judgement Orientation Level: Place;Time Memory: Decreased short-term memory Safety/Judgement: Decreased awareness of safety General Comments: pt slow processing, increased response time  Blood pressure 120/75, pulse 66, temperature 98.1 F (36.7 C), temperature source Oral, resp. rate 18, height 5\' 11"  (1.803 m), weight 92.987 kg (205 lb), SpO2 100.00%. Physical Exam  Vitals reviewed. Constitutional: He is oriented to person, place, and time.  HENT:  Head: Normocephalic.  Eyes: EOM are normal.  No nystagmus  Neck: Normal range of motion. Neck supple. No thyromegaly present.  Cardiovascular: Normal rate and regular rhythm.   Pulmonary/Chest: Effort normal and breath sounds normal. No respiratory distress.  Abdominal: Soft. Bowel sounds are normal. He exhibits distension.  Neurological: He is alert and oriented to person, place, and time.  Follows full commands  Skin: Skin is warm and dry.    No results found for this or any previous visit (from the past 24 hour(s)). Mr Brain Wo Contrast  06/09/2013   CLINICAL DATA:  Stroke. Left-sided weakness. Headache.  EXAM: MRI HEAD WITHOUT CONTRAST  MRA HEAD WITHOUT CONTRAST  TECHNIQUE: Multiplanar, multiecho pulse sequences of the brain and surrounding structures were obtained without intravenous contrast. Angiographic images of the head were obtained using MRA technique without contrast.  COMPARISON:  CT 06/08/2013  FINDINGS: MRI HEAD FINDINGS  Ventricle size is normal. Pituitary normal in size. Craniocervical junction normal.  Patchy hyperintensity in the cerebral white matter bilaterally. Confluent hyperintensity in the subcortical frontal white matter bilaterally. This is symmetric and slightly more pronounced on the left.  Negative  for acute infarct. Negative for hemorrhage. No mass lesion is present.  MRA HEAD FINDINGS  Both  vertebral arteries are patent to the basilar. PICA patent bilaterally. Basilar is widely patent. AICA, superior cerebellar, and posterior cerebral arteries are all widely patent.  Right internal carotid artery widely patent. Right anterior and middle cerebral arteries are normal  Left internal carotid artery is widely patent. Left anterior and middle cerebral arteries are normal  Negative for cerebral aneurysm.  IMPRESSION: MRI HEAD IMPRESSION  Patchy and confluent areas of white matter hyperintensity bilaterally. Given the history of hypertension and hyperlipidemia and smoking, this is most likely all due to chronic microvascular ischemia. No acute infarct or mass.  MRA HEAD IMPRESSION  Negative intracranial MRA.   Electronically Signed   By: Marlan Palau M.D.   On: 06/09/2013 15:51   Mr Cervical Spine Wo Contrast  06/09/2013   CLINICAL DATA:  Neck and left arm pain.  EXAM: MRI CERVICAL SPINE WITHOUT CONTRAST  TECHNIQUE: Multiplanar, multisequence MR imaging was performed. No intravenous contrast was administered.  COMPARISON:  None  FINDINGS: The cervical vertebral bodies are normally aligned and demonstrate normal marrow signal. The cervical spinal cord demonstrates a small focal syrinx at C7. No cord lesions.  Mild to moderate facet disease is noted. No abnormal STIR signal intensity in the paraspinal muscles or posterior elements.  C2-3: No significant findings.  C3-4:  No significant findings.  C4-5:  No significant findings.  C5-6 central and slightly left paracentral disc protrusion with mild mass effect on the left side of the thecal sac. Shallow disc osteophyte complexes bilaterally with mild right foraminal stenosis possibly irritating the right C6 nerve root.  C6-7: Small right paracentral disc protrusion with mild focal mass effect on the ventral thecal sac and narrowing of the ventral CSF space. Shallow disc osteophyte complexes bilaterally with mild right-sided foraminal stenosis.  C7-T1:  No  significant findings.  IMPRESSION: 1. Central and slightly left paracentral disc protrusion at C5-6. Shallow disc osteophyte complexes bilaterally with mild right foraminal stenosis possibly irritating the right C6 nerve root. 2. Small right paracentral disc protrusion at C6-7 and mild right foraminal stenosis. 3. 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   Electronically Signed   By: Loralie Champagne M.D.   On: 06/09/2013 16:06   Dg Swallowing Func-speech Pathology  06/10/2013   Gray Bernhardt, CCC-SLP     06/10/2013  1:39 PM Objective Swallowing Evaluation: Modified Barium Swallowing Study   Patient Details  Name: Bradley Prince MRN: [id number] Date of Birth: 1960-11-04  Today's Date: 06/10/2013 Time: 1010-1110 SLP Time Calculation (min): 60 min  Past Medical History:  Past Medical History  Diagnosis Date  . TIA (transient ischemic attack)   . Hypertension   . Hyperlipidemia    Past Surgical History:  Past Surgical History  Procedure Laterality Date  . Abdominal aortic aneurysm repair    . Cholecystectomy    . Appendectomy     HPI:  Bradley Prince is a 52 y.o. male with basilar history of  hypertension and hyperlipidemia. Patient came in to the hospital  because of acute left-sided weakness. Patient was off of work  since June for the same above complaints. He works at [employer]. About 7:30 today he developed left-sided  weakness, numbness and severe headache. Headache is around his  left eye, patient also mentioned that his left eye was bleeding  with some blurring of vision as well. Denied any slurred speech  or  facial asymmetry during SLP evaluation yesterday. MBS was  recommended to objectively assess swallow function and safety.     Assessment / Plan / Recommendation Clinical Impression  Clinical impression: Pt was given one (1/2tsp) bolus of  puree/barium.  This one swallow was noted to be functional.  Oral  prep and propulsion was WNL, Swallow reflex was timely, no  penetration, aspiration or  post-swallow residue was seen.  Prior  to presentation of subsequent boluses, pt status was noted by SLP  and rad tech to change.  Pt became less interactive, and  exhibited a delay in responding to questions.  A slight tremor of  his head was noted , and pt reported feeling confused.  Pt was  apologetic, stating he didn't mean to get sick, and that he just  wanted to go home.  At that time, SLP and rad tech agreed that  MBS should be discontinued, and medical team should be contacted  to evaluate patient.  SLP stayed with the patient during RN/MD  evaluation.  Decision was made to cancel the MBS and change pt  status to NPO, pending further observation/tests/  SLP assisted  rad tech with taking pt  back to his room (per pt request).     Treatment Recommendation  Therapy as outlined in treatment plan below    Diet Recommendation NPO (per Dr. Thad Ranger. MBS results  inconclusive.)   Medication Administration: Via alternative means    Other  Recommendations Oral Care Recommendations: Oral care QID   Follow Up Recommendations       Frequency and Duration min 1 x/week  1 week   Pertinent Vitals/Pain Headache 7-8/10. RN informed     General Date of Onset: 06/08/13 HPI: Reyaansh Merlo is a 52 y.o. male with basilar history of  hypertension and hyperlipidemia. Patient came in to the hospital  because of acute left-sided weakness. Patient was off of work  since June for the same above complaints. He works at [employer]. About 7:30 today he developed left-sided  weakness, numbness and severe headache. Headache is around his  left eye, patient also mentioned that his left eye was bleeding  with some blurring of vision as well. Denied any slurred speech  or facial asymmetry during SLP evaluation yesterday. MBS was  recommended to objectively assess swallow function and safety. Type of Study: Modified Barium Swallowing Study Reason for Referral: Objectively evaluate swallowing function Previous Swallow  Assessment: SLE yesterday, during which pt  reported difficulty swallowing. MBS was recommended at that time. Diet Prior to this Study: Dysphagia 3 (soft);Nectar-thick liquids Temperature Spikes Noted: No Respiratory Status: Room air History of Recent Intubation: No Behavior/Cognition: Alert;Cooperative;Pleasant mood (pt reported  becoming confused after MBS initiated) Oral Cavity - Dentition: Adequate natural dentition Oral Motor / Sensory Function: Within functional limits Self-Feeding Abilities: Needs assist Patient Positioning: Upright in chair Baseline Vocal Quality: Clear Volitional Cough: Weak Volitional Swallow: Able to elicit Anatomy: Within functional limits Pharyngeal Secretions: Not observed secondary MBS    Reason for Referral Objectively evaluate swallowing function   Oral Phase Oral Preparation/Oral Phase Oral Phase: WFL (limited study - Only 1 bolus given)   Pharyngeal Phase Pharyngeal Phase Pharyngeal Phase: Within functional limits (limited study - only  1 bolus given)  Cervical Esophageal Phase    GO    Cervical Esophageal Phase Cervical Esophageal Phase: WFL (limited study - only 1 bolus  given)    Functional Assessment Tool Used: Clinical judgement Functional Limitations: Swallowing Swallow  Current Status [phone]): At least 40 percent but less than  60 percent impaired, limited or restricted Swallow Goal Status [phone]): At least 1 percent but less than 20  percent impaired, limited or restricted   [name] [facility], CCC-SLP [phone] [name] 06/10/2013, 1:38 PM    Mr [name] Head/brain Wo Cm  06/09/2013   CLINICAL DATA:  Stroke. Left-sided weakness. Headache.  EXAM: MRI HEAD WITHOUT CONTRAST  MRA HEAD WITHOUT CONTRAST  TECHNIQUE: Multiplanar, multiecho pulse sequences of the brain and surrounding structures were obtained without intravenous contrast. Angiographic images of the head were obtained using MRA technique without contrast.  COMPARISON:  CT 06/08/2013  FINDINGS: MRI  HEAD FINDINGS  Ventricle size is normal. Pituitary normal in size. Craniocervical junction normal.  Patchy hyperintensity in the cerebral white matter bilaterally. Confluent hyperintensity in the subcortical frontal white matter bilaterally. This is symmetric and slightly more pronounced on the left.  Negative for acute infarct. Negative for hemorrhage. No mass lesion is present.  MRA HEAD FINDINGS  Both vertebral arteries are patent to the basilar. PICA patent bilaterally. Basilar is widely patent. AICA, superior cerebellar, and posterior cerebral arteries are all widely patent.  Right internal carotid artery widely patent. Right anterior and middle cerebral arteries are normal  Left internal carotid artery is widely patent. Left anterior and middle cerebral arteries are normal  Negative for cerebral aneurysm.  IMPRESSION: MRI HEAD IMPRESSION  Patchy and confluent areas of white matter hyperintensity bilaterally. Given the history of hypertension and hyperlipidemia and smoking, this is most likely all due to chronic microvascular ischemia. No acute infarct or mass.  MRA HEAD IMPRESSION  Negative intracranial MRA.   Electronically Signed   By: Marlan Palau M.D.   On: 06/09/2013 15:51    Assessment/Plan: Diagnosis: Left-sided weakness, likely non-organic. Have nothing to offer from a rehab standpoint. Will defer a formal consult.   06/11/2013

## 2013-06-12 ENCOUNTER — Encounter (HOSPITAL_COMMUNITY): Payer: Self-pay | Admitting: Cardiovascular Disease

## 2013-06-12 ENCOUNTER — Inpatient Hospital Stay (HOSPITAL_COMMUNITY): Payer: BC Managed Care – PPO

## 2013-06-12 DIAGNOSIS — M79609 Pain in unspecified limb: Secondary | ICD-10-CM

## 2013-06-12 MED ORDER — EZETIMIBE 10 MG PO TABS: 10.0000 mg | ORAL_TABLET | Freq: Every day | ORAL | Status: DC

## 2013-06-12 MED ORDER — UNABLE TO FIND: Status: DC

## 2013-06-12 MED ORDER — FLUCONAZOLE 100 MG PO TABS: 100.0000 mg | ORAL_TABLET | Freq: Every day | ORAL | Status: DC

## 2013-06-12 MED ORDER — OXYCODONE HCL 5 MG PO CAPS: 5.0000 mg | ORAL_CAPSULE | ORAL | Status: DC | PRN

## 2013-06-12 NOTE — Evaluation (Signed)
Clinical/Bedside Swallow Evaluation Patient Details  Name: Bradley Prince MRN: [id number] Date of Birth: 1960/10/23  Today's Date: 06/12/2013 Time: 0915-1007 SLP Time Calculation (min): 52 min  Past Medical History:  Past Medical History  Diagnosis Date  . TIA (transient ischemic attack)   . Hypertension   . Hyperlipidemia    Past Surgical History:  Past Surgical History  Procedure Laterality Date  . Abdominal aortic aneurysm repair    . Cholecystectomy    . Appendectomy    . Coronary artery bypass graft    . Tee without cardioversion N/A 06/11/2013    Procedure: TRANSESOPHAGEAL ECHOCARDIOGRAM (TEE);  Surgeon: [name], MD;  Location: [facility] ENDOSCOPY;  Service: Cardiovascular;  Laterality: N/A;   HPI:  Bradley Prince is a 52 y.o. male with basilar history of hypertension and hyperlipidemia. Patient came in to the hospital because of acute left-sided weakness. Patient was off of work since June for the same above complaints. He works at [employer]. About 7:30 today he developed left-sided weakness, numbness and severe headache. Headache is around his left eye, patient also mentioned that his left eye was bleeding with some blurring of vision as well. Denied any slurred speech or facial asymmetry during SLP evaluation yesterday. MBS was recommended to objectively assess swallow function and safety.  This was discontinued due to change in status.  Repeat BSE ordered once procedures completed.   Assessment / Plan / Recommendation Clinical Impression  Pt awake, alert, conversant.  Completed oral care with set up and moderate assistance. Intermittent dry nonproductive cough noted prior to po trials, and throughout po trials.  No oral motor weakness noted, adequate laryngeal elevation on palpation.  Will return to radiology and complete MBS which was discontinued 06/10/13, given continued cough with po trials.    Aspiration Risk  Mild    Diet Recommendation  (pending  MBS results)        Other  Recommendations Recommended Consults: MBS   Follow Up Recommendations       Frequency and Duration        Pertinent Vitals/Pain Headache 6/10 , back pain with cough; RN notified    SLP Swallow Goals  Pending MBS results   Swallow Study Prior Functional Status   Tolerating regular diet with thin liquids prior to admit.    General Date of Onset: 06/08/13 HPI: Bradley Prince is a 52 y.o. male with basilar history of hypertension and hyperlipidemia. Patient came in to the hospital because of acute left-sided weakness. Patient was off of work since June for the same above complaints. He works at [employer]. About 7:30 today he developed left-sided weakness, numbness and severe headache. Headache is around his left eye, patient also mentioned that his left eye was bleeding with some blurring of vision as well. Denied any slurred speech or facial asymmetry during SLP evaluation yesterday. MBS was recommended to objectively assess swallow function and safety.  This was discontinued due to change in status.  Repeat BSE ordered once procedures completed. Type of Study: Bedside swallow evaluation Previous Swallow Assessment: MBS attempted 06/10/13 Diet Prior to this Study: NPO Temperature Spikes Noted: No Respiratory Status: Room air History of Recent Intubation: No Behavior/Cognition: Alert;Cooperative;Pleasant mood Oral Cavity - Dentition: Adequate natural dentition Self-Feeding Abilities: Needs assist Patient Positioning: Upright in bed Volitional Cough: Strong Volitional Swallow: Able to elicit    Oral/Motor/Sensory Function Overall Oral Motor/Sensory Function: Appears within functional limits for tasks assessed   Ice Chips Ice chips: Not  tested   Thin Liquid Thin Liquid: Impaired Presentation: Straw Pharyngeal  Phase Impairments: Cough - Delayed;Other (comments) (intermittent dry nonproductive cough noted)    Nectar Thick Nectar Thick  Liquid: Impaired Presentation: Spoon Pharyngeal Phase Impairments: Cough - Delayed (intermittent dry nonproductive cough noted)   Honey Thick Honey Thick Liquid: Not tested   Puree Puree: Impaired Presentation: Spoon Pharyngeal Phase Impairments: Cough - Delayed (intermittent dry nonproductive cough noted)   Solid   GO   Bradley Prince [facility], CCC-SLP [phone]  Solid: Not tested       Bradley Prince 06/12/2013,10:16 AM

## 2013-06-12 NOTE — Progress Notes (Signed)
After , patient feels better. He got up and walk to bathroom with minimal assistance, still says he feel bad. But for the last 3 days he was coming up with some issues when ever he was ready to discharge. Given all d/c paper work, prescription and roll him down in a wheelchair.

## 2013-06-12 NOTE — Progress Notes (Addendum)
Pt passed swallow screaning and received lunch tray, now complaints about severe nausea. Refused the lunch , but finished two cups of ice cream. Treated with Zofran PO. Also complained about feeling dizzy to get up.

## 2013-06-12 NOTE — Discharge Summary (Signed)
Physician Discharge Summary  Bradley Prince ZOX:[id number] DOB: 03/16/1961 DOA: 06/08/2013  PCP: Marcell Anger, NP  Admit date: 06/08/2013 Discharge date: 06/12/2013  Time spent: 40 minutes  Recommendations for Outpatient Follow-up:  1. Follow up with primary MD.  2. Outpatient physiotherapy.  3. Rolling Walker.   Discharge Diagnoses:  Principal Problem:   Left-sided weakness Active Problems:   Severe headache   Left sided numbness   Blurry vision   HTN (hypertension)   Nausea alone   Dyslipidemia   Smoker   Discharge Condition: Satisfactory.  Diet recommendation: Hear-Healthy.  Filed Weights   06/08/13 1100  Weight: 92.987 kg (205 lb)    History of present illness:  52 y.o. male with history of hypertension, TIA, AAA, s/p repair and hyperlipidemia. Patient came in to the hospital because of acute left-sided weakness. Patient was off of work since June for the same above complaints. He works at [employer]. At about 7:30 AM on 06/08/13, he developed left-sided weakness, numbness and severe headache. Headache was around his left eye, associated blurring of vision as well. Denied any slurred speech or facial asymmetry. In the ED patient evaluated by neurology and workup for TIA, recommended.   Hospital Course:  1. Left-sided weakness/Headache: Patient presented with a left-sided weakness and severe headache. Dr Noel Christmas provided neurology consultation, and patient underwent CVA/TIA workup. Brain MRI/ MRA showed no evidence of acute or concerning findings, vascular duplex revealed 1-39% ICA stenosis bilaterally and antegrade vertebral artery flow. Headache responded to analgesics. Patient was subsequently seen by Dr Delia Heady, who feels that there may be a functional overlay to patient's symptoms. Patient is on low dose ASA, and weakness has improved. PT evaluated and recommended outpatient PT, but OT felt CIR may be considered. Rehab MD consultation  was accordingly requested and patient was deemed suitable for outpatient physiotherapy. Patient apparently failed failed bedside swallow screen on 06/11/13, so is s/p MBS on 06/12/13. Regular Diet/Thin Liquids, recommended.   2. Cervical DJD/Radiculopathy: MR of the C-spine showed shallow disc osteophyte complexes bilaterally with mild right foraminal stenosis possibly irritating the right C6 nerve root, small right paracentral disc protrusion at C6-7 and mild right foraminal stenosis. 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  2. Hypertension: Controlled on pre-admission antihypertensives.  3. Dyslipidemia: Patient was on Lipitor pre-admission, and claims compliance. Lipid profile revealed TC 210, TG 218, HDL 36, LDL 130. Target LDL should be <70. Zetia was added to treatment.  4. Tobacco User: The patient was counseled on the dangers of tobacco use, and was advised to quit. Reviewed strategies to maximize success, including removing cigarettes and smoking materials from environment, stress management and support of family/friends.  5. Aortic valve mass: 2D Echocardiogram of 06/09/13, revealed a small 4 mm round, echogenic mass (possibly calcified nodule) on the non-coronary cusp which appears adherent to the valve, but may be minimally mobile. The significance is uncertain, and the neurology team do not feel that this is related to patient's presenting symptomatology. Dr Leodis Sias performed a TEE on 06/11/13, which showed normal LV function, normal 3 leaflet aortic valve, a small calcified area on the noncoronary cusp of the aortic valve. No vegitation. No AS or AI. No left atrial or LA appendage thrombus and no PFO or ASD by color flow and bubble contrast. Patient has been reassured accordingly.  6. Oral thrush: An incidental finding on physical examination of 06/10/13. Managed with 7-day course of Diflucan, to be concluded on 06/16/13.  7. Left calf pain: Patient complained of left calf pain on 06/12/13.  Venous doppler on that date, was negative for DVT, superficial thrombosis, or Baker's cyst.     Procedures: See Below. LE venous Doppler 06/12/13.   Consultations: Dr Noel Christmas, neurologist.  Dr Leodis Sias, Cardiologist.    Discharge Exam: Filed Vitals:   06/12/13 0546  BP: 119/73  Pulse: 59  Temp: 98.3 F (36.8 C)  Resp: 18    General: Comfortable, alert, communicative, fully oriented, not short of breath at rest.  HEENT: No clinical pallor, no jaundice, no conjunctival injection or discharge. Hydration status is satisfactory.  NECK: Supple, JVP not seen, no carotid bruits, no palpable lymphadenopathy, no palpable goiter.  CHEST: Clinically clear to auscultation, no wheezes, no crackles.  HEART: Sounds 1 and 2 heard, normal, regular, no murmurs.  ABDOMEN: Full, soft, non-tender, no palpable organomegaly, no palpable masses, normal bowel sounds.  GENITALIA: Not examined.  LOWER EXTREMITIES: No pitting edema, palpable peripheral pulses.  MUSCULOSKELETAL SYSTEM: Unremarkable.  CENTRAL NERVOUS SYSTEM: No focal neurologic deficit on gross examination.  Discharge Instructions      Discharge Orders   Future Orders Complete By Expires   Consult to care management  As directed    Comments:     Please arrange outpatient physiotherapy.   Diet - low sodium heart healthy  As directed    For home use only DME Walker rolling  As directed    Increase activity slowly  As directed        Medication List         aspirin EC 81 MG tablet  Take 81 mg by mouth daily.     atorvastatin 40 MG tablet  Commonly known as:  LIPITOR  Take 40 mg by mouth daily.     ezetimibe 10 MG tablet  Commonly known as:  ZETIA  Take 1 tablet (10 mg total) by mouth daily.     fluconazole 100 MG tablet  Commonly known as:  DIFLUCAN  Take 1 tablet (100 mg total) by mouth daily.     indomethacin 25 MG capsule  Commonly known as:  INDOCIN  Take 25 mg by mouth daily as needed (for  headaches).     lisinopril 20 MG tablet  Commonly known as:  PRINIVIL,ZESTRIL  Take 20 mg by mouth daily.     omeprazole 40 MG capsule  Commonly known as:  PRILOSEC  Take 40 mg by mouth daily as needed (for acid reflux).     ondansetron 4 MG tablet  Commonly known as:  ZOFRAN  Take 4 mg by mouth every 8 (eight) hours as needed for nausea.     oxycodone 5 MG capsule  Commonly known as:  OXY-IR  Take 1 capsule (5 mg total) by mouth every 4 (four) hours as needed.     UNABLE TO FIND  Outpatient physiotherapy for persistent left-sided weakness.     varenicline 0.5 MG tablet  Commonly known as:  CHANTIX  Take 0.5 mg by mouth 2 (two) times daily.     verapamil 40 MG tablet  Commonly known as:  CALAN  Take 40 mg by mouth 2 (two) times daily.       Allergies  Allergen Reactions  . Penicillins Itching  . Toradol [Ketorolac Tromethamine] Swelling    Makes my throat swell    Follow-up Information   Schedule an appointment as soon as possible for a visit with Marcell Anger, NP.   Specialty:  Internal  Medicine   Contact information:   [address] [place] [address] [phone]        The results of significant diagnostics from this hospitalization (including imaging, microbiology, ancillary and laboratory) are listed below for reference.    Significant Diagnostic Studies: Dg Chest 2 View  06/08/2013   CLINICAL DATA:  Stroke. Hypertension. Hyperlipidemia.  EXAM: CHEST  2 VIEW  COMPARISON:  None.  FINDINGS: Lateral view degraded by patient arm position. Prior median sternotomy. Patient rotated to the left on the frontal film. Cardiomegaly. Mildly low lung volumes. No pleural effusion or pneumothorax. Clear lungs.  IMPRESSION: Cardiomegaly and low lung volumes, without acute disease.   Electronically Signed   By: Jeronimo Greaves   On: 06/08/2013 20:02   Ct Head Wo Contrast  06/08/2013   CLINICAL DATA:  52 year old male code stroke. Sudden onset left facial droop and  left side weakness. Headache.  EXAM: CT HEAD WITHOUT CONTRAST  TECHNIQUE: Contiguous axial images were obtained from the base of the skull through the vertex without intravenous contrast.  COMPARISON:  None.  FINDINGS: Visualized paranasal sinuses and mastoids are clear. No acute osseous abnormality identified. Visualized orbits and scalp soft tissues are within normal limits.  Normal cerebral volume. No ventriculomegaly. No midline shift, mass effect, or evidence of intracranial mass lesion. No acute intracranial hemorrhage identified.  There is bilateral mostly subcortical scattered white matter hypodensity, nonspecific. No evidence of cortically based acute infarction identified. No suspicious intracranial vascular hyperdensity; the MCA M1 segments appear symmetric.  IMPRESSION: No evidence of cortically based acute infarct. No acute intracranial hemorrhage identified. Nonspecific white matter changes.  Study discussed by telephone with Dr. Noel Christmas on 06/08/2013 at 10:55 .   Electronically Signed   By: Augusto Gamble M.D.   On: 06/08/2013 10:58   Mr Brain Wo Contrast  06/09/2013   CLINICAL DATA:  Stroke. Left-sided weakness. Headache.  EXAM: MRI HEAD WITHOUT CONTRAST  MRA HEAD WITHOUT CONTRAST  TECHNIQUE: Multiplanar, multiecho pulse sequences of the brain and surrounding structures were obtained without intravenous contrast. Angiographic images of the head were obtained using MRA technique without contrast.  COMPARISON:  CT 06/08/2013  FINDINGS: MRI HEAD FINDINGS  Ventricle size is normal. Pituitary normal in size. Craniocervical junction normal.  Patchy hyperintensity in the cerebral white matter bilaterally. Confluent hyperintensity in the subcortical frontal white matter bilaterally. This is symmetric and slightly more pronounced on the left.  Negative for acute infarct. Negative for hemorrhage. No mass lesion is present.  MRA HEAD FINDINGS  Both vertebral arteries are patent to the basilar. PICA patent  bilaterally. Basilar is widely patent. AICA, superior cerebellar, and posterior cerebral arteries are all widely patent.  Right internal carotid artery widely patent. Right anterior and middle cerebral arteries are normal  Left internal carotid artery is widely patent. Left anterior and middle cerebral arteries are normal  Negative for cerebral aneurysm.  IMPRESSION: MRI HEAD IMPRESSION  Patchy and confluent areas of white matter hyperintensity bilaterally. Given the history of hypertension and hyperlipidemia and smoking, this is most likely all due to chronic microvascular ischemia. No acute infarct or mass.  MRA HEAD IMPRESSION  Negative intracranial MRA.   Electronically Signed   By: Marlan Palau M.D.   On: 06/09/2013 15:51   Mr Cervical Spine Wo Contrast  06/09/2013   CLINICAL DATA:  Neck and left arm pain.  EXAM: MRI CERVICAL SPINE WITHOUT CONTRAST  TECHNIQUE: Multiplanar, multisequence MR imaging was performed. No intravenous contrast was  administered.  COMPARISON:  None  FINDINGS: The cervical vertebral bodies are normally aligned and demonstrate normal marrow signal. The cervical spinal cord demonstrates a small focal syrinx at C7. No cord lesions.  Mild to moderate facet disease is noted. No abnormal STIR signal intensity in the paraspinal muscles or posterior elements.  C2-3: No significant findings.  C3-4:  No significant findings.  C4-5:  No significant findings.  C5-6 central and slightly left paracentral disc protrusion with mild mass effect on the left side of the thecal sac. Shallow disc osteophyte complexes bilaterally with mild right foraminal stenosis possibly irritating the right C6 nerve root.  C6-7: Small right paracentral disc protrusion with mild focal mass effect on the ventral thecal sac and narrowing of the ventral CSF space. Shallow disc osteophyte complexes bilaterally with mild right-sided foraminal stenosis.  C7-T1:  No significant findings.  IMPRESSION: 1. Central and slightly  left paracentral disc protrusion at C5-6. Shallow disc osteophyte complexes bilaterally with mild right foraminal stenosis possibly irritating the right C6 nerve root. 2. Small right paracentral disc protrusion at C6-7 and mild right foraminal stenosis. 3. 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   Electronically Signed   By: Loralie Champagne M.D.   On: 06/09/2013 16:06   Dg Swallowing Func-speech Pathology  06/10/2013   Gray Bernhardt, CCC-SLP     06/10/2013  1:39 PM Objective Swallowing Evaluation: Modified Barium Swallowing Study   Patient Details  Name: Bradley Prince MRN: [id number] Date of Birth: [birth year]  Today's Date: 06/10/2013 Time: 1010-1110 SLP Time Calculation (min): 60 min  Past Medical History:  Past Medical History  Diagnosis Date  . TIA (transient ischemic attack)   . Hypertension   . Hyperlipidemia    Past Surgical History:  Past Surgical History  Procedure Laterality Date  . Abdominal aortic aneurysm repair    . Cholecystectomy    . Appendectomy     HPI:  Bradley Prince is a 52 y.o. male with basilar history of  hypertension and hyperlipidemia. Patient came in to the hospital  because of acute left-sided weakness. Patient was off of work  since June for the same above complaints. He works at [employer]. About 7:30 today he developed left-sided  weakness, numbness and severe headache. Headache is around his  left eye, patient also mentioned that his left eye was bleeding  with some blurring of vision as well. Denied any slurred speech  or facial asymmetry during SLP evaluation yesterday. MBS was  recommended to objectively assess swallow function and safety.     Assessment / Plan / Recommendation Clinical Impression  Clinical impression: Pt was given one (1/2tsp) bolus of  puree/barium.  This one swallow was noted to be functional.  Oral  prep and propulsion was WNL, Swallow reflex was timely, no  penetration, aspiration or post-swallow residue was seen.  Prior  to presentation of  subsequent boluses, pt status was noted by SLP  and rad tech to change.  Pt became less interactive, and  exhibited a delay in responding to questions.  A slight tremor of  his head was noted , and pt reported feeling confused.  Pt was  apologetic, stating he didn't mean to get sick, and that he just  wanted to go home.  At that time, SLP and rad tech agreed that  MBS should be discontinued, and medical team should be contacted  to evaluate patient.  SLP stayed with the patient during RN/MD  evaluation.  Decision was  made to cancel the MBS and change pt  status to NPO, pending further observation/tests/  SLP assisted  rad tech with taking pt  back to his room (per pt request).     Treatment Recommendation  Therapy as outlined in treatment plan below    Diet Recommendation NPO (per Dr. Thad Ranger. MBS results  inconclusive.)   Medication Administration: Via alternative means    Other  Recommendations Oral Care Recommendations: Oral care QID   Follow Up Recommendations       Frequency and Duration min 1 x/week  1 week   Pertinent Vitals/Pain Headache 7-8/10. RN informed     General Date of Onset: 06/08/13 HPI: Bradley Prince is a 52 y.o. male with basilar history of  hypertension and hyperlipidemia. Patient came in to the hospital  because of acute left-sided weakness. Patient was off of work  since June for the same above complaints. He works at [employer]. About 7:30 today he developed left-sided  weakness, numbness and severe headache. Headache is around his  left eye, patient also mentioned that his left eye was bleeding  with some blurring of vision as well. Denied any slurred speech  or facial asymmetry during SLP evaluation yesterday. MBS was  recommended to objectively assess swallow function and safety. Type of Study: Modified Barium Swallowing Study Reason for Referral: Objectively evaluate swallowing function Previous Swallow Assessment: SLE yesterday, during which pt  reported difficulty  swallowing. MBS was recommended at that time. Diet Prior to this Study: Dysphagia 3 (soft);Nectar-thick liquids Temperature Spikes Noted: No Respiratory Status: Room air History of Recent Intubation: No Behavior/Cognition: Alert;Cooperative;Pleasant mood (pt reported  becoming confused after MBS initiated) Oral Cavity - Dentition: Adequate natural dentition Oral Motor / Sensory Function: Within functional limits Self-Feeding Abilities: Needs assist Patient Positioning: Upright in chair Baseline Vocal Quality: Clear Volitional Cough: Weak Volitional Swallow: Able to elicit Anatomy: Within functional limits Pharyngeal Secretions: Not observed secondary MBS    Reason for Referral Objectively evaluate swallowing function   Oral Phase Oral Preparation/Oral Phase Oral Phase: WFL (limited study - Only 1 bolus given)   Pharyngeal Phase Pharyngeal Phase Pharyngeal Phase: Within functional limits (limited study - only  1 bolus given)  Cervical Esophageal Phase    GO    Cervical Esophageal Phase Cervical Esophageal Phase: WFL (limited study - only 1 bolus  given)    Functional Assessment Tool Used: Clinical judgement Functional Limitations: Swallowing Swallow Current Status (Z6109): At least 40 percent but less than  60 percent impaired, limited or restricted Swallow Goal Status [phone]): At least 1 percent but less than 20  percent impaired, limited or restricted   [name] [facility], CCC-SLP [phone] [name] 06/10/2013, 1:38 PM    Mr [name] Head/brain Wo Cm  06/09/2013   CLINICAL DATA:  Stroke. Left-sided weakness. Headache.  EXAM: MRI HEAD WITHOUT CONTRAST  MRA HEAD WITHOUT CONTRAST  TECHNIQUE: Multiplanar, multiecho pulse sequences of the brain and surrounding structures were obtained without intravenous contrast. Angiographic images of the head were obtained using MRA technique without contrast.  COMPARISON:  CT 06/08/2013  FINDINGS: MRI HEAD FINDINGS  Ventricle size is normal. Pituitary normal in  size. Craniocervical junction normal.  Patchy hyperintensity in the cerebral white matter bilaterally. Confluent hyperintensity in the subcortical frontal white matter bilaterally. This is symmetric and slightly more pronounced on the left.  Negative for acute infarct. Negative for hemorrhage. No mass lesion is present.  MRA HEAD FINDINGS  Both vertebral  arteries are patent to the basilar. PICA patent bilaterally. Basilar is widely patent. AICA, superior cerebellar, and posterior cerebral arteries are all widely patent.  Right internal carotid artery widely patent. Right anterior and middle cerebral arteries are normal  Left internal carotid artery is widely patent. Left anterior and middle cerebral arteries are normal  Negative for cerebral aneurysm.  IMPRESSION: MRI HEAD IMPRESSION  Patchy and confluent areas of white matter hyperintensity bilaterally. Given the history of hypertension and hyperlipidemia and smoking, this is most likely all due to chronic microvascular ischemia. No acute infarct or mass.  MRA HEAD IMPRESSION  Negative intracranial MRA.   Electronically Signed   By: Marlan Palau M.D.   On: 06/09/2013 15:51    Microbiology: No results found for this or any previous visit (from the past 240 hour(s)).   Labs: Basic Metabolic Panel:  Recent Labs Lab 06/08/13 1100 06/08/13 1116 06/11/13 0540  NA 136 140 137  K 3.9 4.0 4.1  CL 101 105 102  CO2 26  --  30  GLUCOSE 100* 100* 83  BUN 9 8 10   CREATININE 0.87 0.90 0.90  CALCIUM 9.6  --  8.6   Liver Function Tests:  Recent Labs Lab 06/08/13 1100  AST 18  ALT 20  ALKPHOS 139*  BILITOT 0.8  PROT 7.4  ALBUMIN 4.0   No results found for this basename: LIPASE, AMYLASE,  in the last 168 hours No results found for this basename: AMMONIA,  in the last 168 hours CBC:  Recent Labs Lab 06/08/13 1100 06/08/13 1116  WBC 7.5  --   NEUTROABS 5.0  --   HGB 15.9 16.0  HCT 45.7 47.0  MCV 88.9  --   PLT 315  --    Cardiac  Enzymes:  Recent Labs Lab 06/08/13 1100 06/11/13 0540 06/11/13 1136  TROPONINI <0.30 <0.30 <0.30   BNP: BNP (last 3 results) No results found for this basename: PROBNP,  in the last 8760 hours CBG:  Recent Labs Lab 06/08/13 1052  GLUCAP 92       Signed:  Shirlee Whitmire,CHRISTOPHER  [facility] 06/12/2013, 12:23 PM

## 2013-06-12 NOTE — Progress Notes (Signed)
VASCULAR LAB PRELIMINARY  PRELIMINARY  PRELIMINARY  PRELIMINARY  Left lower extremity venous duplex  completed.    Preliminary report:  Left:  No evidence of DVT, superficial thrombosis, or Baker's cyst.   Serine Kea, RVT 06/12/2013, 11:00 AM

## 2013-06-12 NOTE — Progress Notes (Signed)
Talked to patient about discharge planning/ outpatient therapies; patient requested to go to [facility] - [address] [place] ( tele # [phone]). [facility] called/ CM awaiting for call back; patient stated that he does not want a rolling walker because he has one at home; B [employer]

## 2013-06-12 NOTE — Procedures (Signed)
Objective Swallowing Evaluation: Bedside swallow evaluation  Patient Details  Name: Bradley Prince MRN: [id number] Date of Birth: [birth year]  Today's Date: 06/12/2013 Time: 1100-1141 SLP Time Calculation (min): 41 min  Past Medical History:  Past Medical History  Diagnosis Date  . TIA (transient ischemic attack)   . Hypertension   . Hyperlipidemia    Past Surgical History:  Past Surgical History  Procedure Laterality Date  . Abdominal aortic aneurysm repair    . Cholecystectomy    . Appendectomy    . Coronary artery bypass graft    . Tee without cardioversion N/A 06/11/2013    Procedure: TRANSESOPHAGEAL ECHOCARDIOGRAM (TEE);  Surgeon: [name], MD;  Location: [facility] ENDOSCOPY;  Service: Cardiovascular;  Laterality: N/A;   HPI:  Bradley Prince is a 52 y.o. male with basilar history of hypertension and hyperlipidemia. Patient came in to the hospital because of acute left-sided weakness. Patient was off of work since June for the same above complaints. He works at [employer]. About 7:30 he developed left-sided weakness, numbness and severe headache. Headache is around his left eye, patient also mentioned that his left eye was bleeding with some blurring of vision as well. Denied any slurred speech or facial asymmetry during SLP evaluation yesterday. MBS was recommended to objectively assess swallow function and safety.  This was discontinued due to change in status.  Repeat BSE ordered once procedures completed.  Dry cough again observed.  Will return to radiology and complete MBS.     Assessment / Plan / Recommendation Clinical Impression  Dysphagia Diagnosis: Within Functional Limits Clinical impression: Normal oropharyngeal swallow. No oral deficits or delay, timely swallow reflex, no penetration or aspiration, no post-swallow residue, no backflow from esophagus to pharynx.  Dry nonproductive cough noted intermittently throughout the study.  Will recommend  regular diet with thin liquids, meds whole with liquid.  Results were reviewed with pt.  No family present.  MD notified of results.  No further ST intervention recommended at this time.    Treatment Recommendation  No treatment recommended at this time    Diet Recommendation Regular;Thin liquid   Liquid Administration via: Cup;Straw Medication Administration: Whole meds with liquid Supervision: Patient able to self feed Compensations: Slow rate;Small sips/bites Postural Changes and/or Swallow Maneuvers: Seated upright 90 degrees    Other  Recommendations Recommended Consults: MBS Oral Care Recommendations: Oral care BID;Patient independent with oral care   Follow Up Recommendations  None            Pertinent Vitals/Pain Headache 7/10     General Date of Onset: 06/08/13 HPI: Bradley Prince is a 52 y.o. male with basilar history of hypertension and hyperlipidemia. Patient came in to the hospital because of acute left-sided weakness. Patient was off of work since June for the same above complaints. He works at [employer]. About 7:30 he developed left-sided weakness, numbness and severe headache. Headache is around his left eye, patient also mentioned that his left eye was bleeding with some blurring of vision as well. Denied any slurred speech or facial asymmetry during SLP evaluation yesterday. MBS was recommended to objectively assess swallow function and safety.  This was discontinued due to change in status.  Repeat BSE ordered once procedures completed.  Dry cough again observed.  Will return to radiology and complete MBS. Type of Study: Bedside swallow evaluation Reason for Referral: Objectively evaluate swallowing function Previous Swallow Assessment: MBS attempted 06/10/13 Diet Prior to this Study: NPO Temperature Spikes Noted:  No Respiratory Status: Room air History of Recent Intubation: No Behavior/Cognition: Alert;Cooperative;Pleasant mood Oral Cavity -  Dentition: Adequate natural dentition Oral Motor / Sensory Function: Within functional limits Self-Feeding Abilities: Needs assist Patient Positioning: Upright in chair Baseline Vocal Quality: Clear Volitional Cough: Strong Volitional Swallow: Able to elicit Anatomy: Within functional limits Pharyngeal Secretions: Not observed secondary MBS    Reason for Referral Objectively evaluate swallowing function   Oral Phase Oral Preparation/Oral Phase Oral Phase: WFL   Pharyngeal Phase Pharyngeal Phase Pharyngeal Phase: Within functional limits  Cervical Esophageal Phase    GO Bradley Prince B. Murvin [facility], CCC-SLP [phone]   Cervical Esophageal Phase Cervical Esophageal Phase: [facility]         [name] 06/12/2013, 11:47 AM

## 2013-06-15 NOTE — Progress Notes (Signed)
Clinical information and orders for Outpatient rehab faxed to [facility] as requested, they will contact the patient at home for a date and time for start of therapies; B Ave Filter RN,BSN,MHA

## 2013-06-18 ENCOUNTER — Encounter: Payer: Self-pay | Admitting: Family

## 2013-06-18 ENCOUNTER — Encounter: Payer: Self-pay | Admitting: *Deleted

## 2013-07-18 ENCOUNTER — Encounter: Payer: Self-pay | Admitting: Family

## 2013-08-17 ENCOUNTER — Encounter: Payer: Self-pay | Admitting: Family

## 2013-09-17 ENCOUNTER — Encounter: Payer: Self-pay | Admitting: Family

## 2013-10-18 ENCOUNTER — Encounter: Payer: Self-pay | Admitting: Family

## 2013-11-15 ENCOUNTER — Encounter: Payer: Self-pay | Admitting: Family

## 2014-03-04 ENCOUNTER — Observation Stay (HOSPITAL_COMMUNITY)
Admission: EM | Admit: 2014-03-04 | Discharge: 2014-03-05 | Disposition: A | Discharge status: Home / Self Care | Payer: BC Managed Care – PPO | Attending: Internal Medicine | Admitting: Internal Medicine

## 2014-03-04 ENCOUNTER — Encounter (HOSPITAL_COMMUNITY): Payer: Self-pay | Admitting: Emergency Medicine

## 2014-03-04 ENCOUNTER — Emergency Department (HOSPITAL_COMMUNITY): Payer: BC Managed Care – PPO

## 2014-03-04 DIAGNOSIS — G8929 Other chronic pain: Secondary | ICD-10-CM | POA: Insufficient documentation

## 2014-03-04 DIAGNOSIS — F17200 Nicotine dependence, unspecified, uncomplicated: Secondary | ICD-10-CM | POA: Insufficient documentation

## 2014-03-04 DIAGNOSIS — T671XXS Heat syncope, sequela: Secondary | ICD-10-CM

## 2014-03-04 DIAGNOSIS — F411 Generalized anxiety disorder: Secondary | ICD-10-CM

## 2014-03-04 DIAGNOSIS — Z79899 Other long term (current) drug therapy: Secondary | ICD-10-CM | POA: Insufficient documentation

## 2014-03-04 DIAGNOSIS — F419 Anxiety disorder, unspecified: Secondary | ICD-10-CM

## 2014-03-04 DIAGNOSIS — R3129 Other microscopic hematuria: Secondary | ICD-10-CM | POA: Insufficient documentation

## 2014-03-04 DIAGNOSIS — I6529 Occlusion and stenosis of unspecified carotid artery: Secondary | ICD-10-CM | POA: Insufficient documentation

## 2014-03-04 DIAGNOSIS — Z7982 Long term (current) use of aspirin: Secondary | ICD-10-CM | POA: Insufficient documentation

## 2014-03-04 DIAGNOSIS — J45909 Unspecified asthma, uncomplicated: Secondary | ICD-10-CM | POA: Insufficient documentation

## 2014-03-04 DIAGNOSIS — I951 Orthostatic hypotension: Secondary | ICD-10-CM | POA: Diagnosis present

## 2014-03-04 DIAGNOSIS — R51 Headache: Secondary | ICD-10-CM | POA: Insufficient documentation

## 2014-03-04 DIAGNOSIS — R011 Cardiac murmur, unspecified: Secondary | ICD-10-CM | POA: Insufficient documentation

## 2014-03-04 DIAGNOSIS — I658 Occlusion and stenosis of other precerebral arteries: Secondary | ICD-10-CM | POA: Insufficient documentation

## 2014-03-04 DIAGNOSIS — I739 Peripheral vascular disease, unspecified: Secondary | ICD-10-CM

## 2014-03-04 DIAGNOSIS — E785 Hyperlipidemia, unspecified: Secondary | ICD-10-CM | POA: Diagnosis present

## 2014-03-04 DIAGNOSIS — I10 Essential (primary) hypertension: Secondary | ICD-10-CM | POA: Diagnosis present

## 2014-03-04 DIAGNOSIS — I6990 Unspecified sequelae of unspecified cerebrovascular disease: Secondary | ICD-10-CM

## 2014-03-04 DIAGNOSIS — I779 Disorder of arteries and arterioles, unspecified: Secondary | ICD-10-CM | POA: Diagnosis present

## 2014-03-04 DIAGNOSIS — Z8673 Personal history of transient ischemic attack (TIA), and cerebral infarction without residual deficits: Secondary | ICD-10-CM | POA: Insufficient documentation

## 2014-03-04 DIAGNOSIS — R519 Headache, unspecified: Secondary | ICD-10-CM | POA: Diagnosis present

## 2014-03-04 DIAGNOSIS — R55 Syncope and collapse: Principal | ICD-10-CM | POA: Diagnosis present

## 2014-03-04 DIAGNOSIS — I25810 Atherosclerosis of coronary artery bypass graft(s) without angina pectoris: Secondary | ICD-10-CM | POA: Diagnosis present

## 2014-03-04 HISTORY — DX: Cardiac murmur, unspecified: R01.1

## 2014-03-04 HISTORY — DX: Sleep apnea, unspecified: G47.30

## 2014-03-04 LAB — RAPID URINE DRUG SCREEN, HOSP PERFORMED
Amphetamines: NOT DETECTED
BARBITURATES: NOT DETECTED
Benzodiazepines: NOT DETECTED
COCAINE: NOT DETECTED
Opiates: POSITIVE — AB
Tetrahydrocannabinol: NOT DETECTED

## 2014-03-04 LAB — CBC WITH DIFFERENTIAL/PLATELET
Basophils Absolute: 0 10*3/uL (ref 0.0–0.1)
Basophils Relative: 0 % (ref 0–1)
Eosinophils Absolute: 0.1 10*3/uL (ref 0.0–0.7)
Eosinophils Relative: 1 % (ref 0–5)
HCT: 43.8 % (ref 39.0–52.0)
HEMOGLOBIN: 14.4 g/dL (ref 13.0–17.0)
LYMPHS ABS: 1.8 10*3/uL (ref 0.7–4.0)
Lymphocytes Relative: 28 % (ref 12–46)
MCH: 29.9 pg (ref 26.0–34.0)
MCHC: 32.9 g/dL (ref 30.0–36.0)
MCV: 90.9 fL (ref 78.0–100.0)
MONOS PCT: 7 % (ref 3–12)
Monocytes Absolute: 0.5 10*3/uL (ref 0.1–1.0)
NEUTROS ABS: 4.1 10*3/uL (ref 1.7–7.7)
NEUTROS PCT: 64 % (ref 43–77)
Platelets: 280 10*3/uL (ref 150–400)
RBC: 4.82 MIL/uL (ref 4.22–5.81)
RDW: 13.7 % (ref 11.5–15.5)
WBC: 6.5 10*3/uL (ref 4.0–10.5)

## 2014-03-04 LAB — I-STAT CHEM 8, ED
BUN: 10 mg/dL (ref 6–23)
CREATININE: 0.9 mg/dL (ref 0.50–1.35)
Calcium, Ion: 1.25 mmol/L — ABNORMAL HIGH (ref 1.12–1.23)
Chloride: 102 mEq/L (ref 96–112)
GLUCOSE: 121 mg/dL — AB (ref 70–99)
HEMATOCRIT: 47 % (ref 39.0–52.0)
Hemoglobin: 16 g/dL (ref 13.0–17.0)
POTASSIUM: 4.5 meq/L (ref 3.7–5.3)
Sodium: 142 mEq/L (ref 137–147)
TCO2: 27 mmol/L (ref 0–100)

## 2014-03-04 LAB — URINALYSIS, ROUTINE W REFLEX MICROSCOPIC
BILIRUBIN URINE: NEGATIVE
Glucose, UA: NEGATIVE mg/dL
Ketones, ur: NEGATIVE mg/dL
Leukocytes, UA: NEGATIVE
NITRITE: NEGATIVE
Protein, ur: NEGATIVE mg/dL
SPECIFIC GRAVITY, URINE: 1.015 (ref 1.005–1.030)
Urobilinogen, UA: 0.2 mg/dL (ref 0.0–1.0)
pH: 6 (ref 5.0–8.0)

## 2014-03-04 LAB — URINE MICROSCOPIC-ADD ON

## 2014-03-04 LAB — I-STAT TROPONIN, ED: TROPONIN I, POC: 0.01 ng/mL (ref 0.00–0.08)

## 2014-03-04 LAB — CBG MONITORING, ED: Glucose-Capillary: 160 mg/dL — ABNORMAL HIGH (ref 70–99)

## 2014-03-04 MED ORDER — ACETAMINOPHEN 325 MG PO TABS
650.0000 mg | ORAL_TABLET | Freq: Once | ORAL | Status: AC
  Administered 2014-03-04: 650 mg via ORAL
  Filled 2014-03-04: qty 2

## 2014-03-04 MED ORDER — SODIUM CHLORIDE 0.9 % IV SOLN
INTRAVENOUS | Status: AC
  Administered 2014-03-04: 20:00:00 via INTRAVENOUS

## 2014-03-04 MED ORDER — SODIUM CHLORIDE 0.9 % IV BOLUS (SEPSIS)
1000.0000 mL | Freq: Once | INTRAVENOUS | Status: AC
  Administered 2014-03-04: 1000 mL via INTRAVENOUS

## 2014-03-04 MED ORDER — SODIUM CHLORIDE 0.9 % IV SOLN
Freq: Once | INTRAVENOUS | Status: AC
  Administered 2014-03-04: 10:00:00 via INTRAVENOUS

## 2014-03-04 MED ORDER — MORPHINE SULFATE 4 MG/ML IJ SOLN
6.0000 mg | Freq: Once | INTRAMUSCULAR | Status: AC
  Administered 2014-03-04: 6 mg via INTRAVENOUS
  Filled 2014-03-04: qty 2

## 2014-03-04 MED ORDER — DIPHENHYDRAMINE HCL 50 MG/ML IJ SOLN
25.0000 mg | Freq: Once | INTRAMUSCULAR | Status: AC
  Administered 2014-03-04: 25 mg via INTRAVENOUS
  Filled 2014-03-04: qty 1

## 2014-03-04 MED ORDER — ENOXAPARIN SODIUM 40 MG/0.4ML ~~LOC~~ SOLN
40.0000 mg | SUBCUTANEOUS | Status: DC
  Administered 2014-03-04: 40 mg via SUBCUTANEOUS
  Filled 2014-03-04 (×3): qty 0.4

## 2014-03-04 MED ORDER — OXYCODONE-ACETAMINOPHEN 5-325 MG PO TABS
1.0000 | ORAL_TABLET | Freq: Once | ORAL | Status: AC
  Administered 2014-03-04: 1 via ORAL
  Filled 2014-03-04: qty 1

## 2014-03-04 MED ORDER — DIPHENHYDRAMINE HCL 50 MG/ML IJ SOLN
25.0000 mg | Freq: Once | INTRAMUSCULAR | Status: AC
  Administered 2014-03-04: 25 mg via INTRAVENOUS
  Filled 2014-03-04: qty 1
  Filled 2014-03-04: qty 0.5

## 2014-03-04 MED ORDER — OXYCODONE-ACETAMINOPHEN 5-325 MG PO TABS
1.0000 | ORAL_TABLET | Freq: Three times a day (TID) | ORAL | Status: DC | PRN
  Administered 2014-03-04: 1 via ORAL
  Filled 2014-03-04: qty 1

## 2014-03-04 MED ORDER — HYDROXYZINE HCL 25 MG PO TABS
25.0000 mg | ORAL_TABLET | Freq: Once | ORAL | Status: AC
  Administered 2014-03-04: 25 mg via ORAL
  Filled 2014-03-04: qty 1

## 2014-03-04 MED ORDER — ATORVASTATIN CALCIUM 10 MG PO TABS
10.0000 mg | ORAL_TABLET | Freq: Every day | ORAL | Status: DC
  Administered 2014-03-04: 10 mg via ORAL
  Filled 2014-03-04 (×2): qty 1

## 2014-03-04 MED ORDER — SODIUM CHLORIDE 0.9 % IV SOLN
INTRAVENOUS | Status: DC
  Administered 2014-03-04: 11:00:00 via INTRAVENOUS

## 2014-03-04 MED ORDER — SODIUM CHLORIDE 0.9 % IV SOLN: INTRAVENOUS | Status: DC

## 2014-03-04 MED ORDER — ONDANSETRON HCL 4 MG/2ML IJ SOLN
4.0000 mg | Freq: Once | INTRAMUSCULAR | Status: AC
  Administered 2014-03-04: 4 mg via INTRAVENOUS
  Filled 2014-03-04: qty 2

## 2014-03-04 NOTE — H&P (Signed)
Date: 03/04/2014               Patient Name:  Bradley Prince MRN: [id number]  DOB: 1960/11/23 Age / Sex: 53 y.o., male   PCP: Gennette Pac, NP           Medical Service: Internal Medicine Teaching Service         Attending Physician: Dr. Sid Falcon, MD    First Contact: Dr. Michail Jewels, MD Pager: [phone] (7AM-5PM Mon-Fri)  Second Contact: Dr. Jessee Avers, MD Pager: [phone]       After Hours (After 5p/  First Contact Pager: [phone]  weekends / holidays): Second Contact Pager: [phone]    Most Recent Discharge Date:  06/12/13  Chief Complaint:  Chief Complaint  Patient presents with  . Near Syncope       History of Present Illness:  Bradley Prince is a 53 y.o. male who has a past medical history of ?CAD; Peripheral arterial disease (2012); Asthma, Anxiety, chronic HA, TIA; Hypertension; and Dyslipidemia.  Pt presents to the ED with a presyncopal episode while at work today.  He works at [employer] and was standing out in the heat when he began to feel generalized weakness, lightheadedness, diaphoresis, and dizziness.  He also endorses HA and nausea but denies any fever/chills, changes in vision, SOB, CP, palpitations, vomiting, bladder or bowel incontinence, or tremors.  He remembers the event and never lost consciousness nor felt confused afterward.  He reports having several episodes similar to this in the past but no etiology was found.  He was referred by his PCP to neurology for further evaluation of his presyncopal episodes but has not followed up.  He denies any new medications or changes in current medications.  He smokes about 1/2 ppd.  He denies alcohol or recreational drug use.  In the ED, he was given NS, zofran, and percocet for HA.     Meds: Current Facility-Administered Medications  Medication Dose Route Frequency Provider Last Rate Last Dose  . 0.9 %  sodium chloride infusion   Intravenous Continuous Jessee Avers, MD 150 mL/hr at 03/04/14 2028     . atorvastatin (LIPITOR) tablet 10 mg  10 mg Oral q1800 Jessee Avers, MD   10 mg at 03/04/14 1704  . enoxaparin (LOVENOX) injection 40 mg  40 mg Subcutaneous Q24H Jessee Avers, MD   40 mg at 03/04/14 1704  . oxyCODONE-acetaminophen (PERCOCET/ROXICET) 5-325 MG per tablet 1 tablet  1 tablet Oral Q8H PRN Jessee Avers, MD   1 tablet at 03/04/14 1837    Prescriptions prior to admission  Medication Sig Dispense Refill  . aspirin EC 81 MG tablet Take 81 mg by mouth daily.      . cyclobenzaprine (FLEXERIL) 10 MG tablet Take 10 mg by mouth.       . indomethacin (INDOCIN) 25 MG capsule Take 25 mg by mouth daily as needed (for headaches).      Marland Kitchen lisinopril (PRINIVIL,ZESTRIL) 20 MG tablet Take 20 mg by mouth daily.      . rosuvastatin (CRESTOR) 5 MG tablet Take 5 mg by mouth daily.      . verapamil (CALAN-SR) 120 MG CR tablet Take 120 mg by mouth 3 (three) times daily.        Allergies: Allergies as of 03/04/2014 - Review Complete 03/04/2014  Allergen Reaction Noted  . Ketorolac tromethamine Swelling 10/10/2011  . Penicillins Itching 10/10/2011  . Toradol [ketorolac tromethamine] Swelling 06/08/2013    PMH:  Past Medical History  Diagnosis Date  . Anxiety   . Tobacco abuse   . Peripheral arterial occlusive disease 2012    s/p PTCA/stent  . Coronary artery disease   . Mental disorder     histronic personality disorder  . TIA (transient ischemic attack)   . Hypertension   . Hyperlipidemia   . Heart murmur   . Sleep apnea     PSH: Past Surgical History  Procedure Laterality Date  . Renal artery stents    . Abdominal aortic aneurysm repair    . Cholecystectomy    . Appendectomy    . Coronary artery bypass graft    . Tee without cardioversion N/A 06/11/2013    Procedure: TRANSESOPHAGEAL ECHOCARDIOGRAM (TEE);  Surgeon: Thayer Headings, MD;  Location: [facility] ENDOSCOPY;  Service: Cardiovascular;  Laterality: N/A;    FH: Family History  Problem Relation Age of Onset  . Diabetes  Mother   . Hypertension Father   . Pancreatic cancer Mother   . Alcohol abuse Father   . Alcohol abuse Brother   . Alcohol abuse Paternal Uncle     SH: History  Substance Use Topics  . Smoking status: Current Every Day Smoker -- 0.50 packs/day for 15 years    Types: Cigarettes  . Smokeless tobacco: Never Used  . Alcohol Use: No     Comment: social    Review of Systems: Pertinent items are noted in HPI.  Physical Exam: Filed Vitals:   03/04/14 1400 03/04/14 1451 03/04/14 1500 03/04/14 2020  BP: 145/82 143/88 130/94 149/86  Pulse: 70 57 61 67  Temp:  97.4 F (36.3 C)  97.6 F (36.4 C)  TempSrc:  Oral  Oral  Resp: 17 16  18   Height:  5' 11"  (1.803 m)    Weight:  208 lb 15.9 oz (94.8 kg)    SpO2: 97% 97%  97%    Physical Exam Constitutional: Vital signs reviewed.  Patient is well-developed and well-nourished in no acute distress.  He appears his stated age.  He is somewhat agitated with being in the hospital but is cooperative with our exam.   Head: Normocephalic and atraumatic Eyes: PERRL, EOMI, conjunctivae normal, no scleral icterus.  Neck: Supple, Trachea midline normal ROM, no JVD, or carotid bruit present.  Cardiovascular: RRR, no MRG, pulses symmetric and intact bilaterally Pulmonary/Chest: normal respiratory effort, CTAB, no wheezes, rales, or rhonchi Abdominal: Soft. Non-tender, non-distended, bowel sounds are normal Neurological: A&O x3, cranial nerve II-XII are grossly intact, no focal motor deficit  Skin: Warm, dry and intact. No rash, cyanosis, or clubbing.  Psychiatric: Patient is irritated and wishes to go home.      Lab results:  Basic Metabolic Panel:  Recent Labs  03/04/14 0849  NA 142  K 4.5  CL 102  GLUCOSE 121*  BUN 10  CREATININE 0.90   Anion Gap:   Calcium/Magnesium/Phosphorus: No results found for this basename: CALCIUM, MG, PHOS,  in the last 168 hours  Liver Function Tests: No results found for this basename: AST, ALT,  ALKPHOS, BILITOT, PROT, ALBUMIN,  in the last 72 hours No results found for this basename: LIPASE, AMYLASE,  in the last 72 hours No results found for this basename: AMMONIA,  in the last 72 hours  CBC: Lab Results  Component Value Date   WBC 6.5 03/04/2014   HGB 16.0 03/04/2014   HCT 47.0 03/04/2014   MCV 90.9 03/04/2014   PLT 280 03/04/2014    Lipase: No  results found for this basename: LIPASE    Lactic Acid/Procalcitonin: No results found for this basename: LATICACIDVEN, PROCALCITON, O2SATVEN,  in the last 168 hours  Cardiac Enzymes:  Recent Labs  03/04/14 0855  TROPIPOC 0.01   Lab Results  Component Value Date   CKTOTAL 78 10/13/2011   CKMB 1.6 10/13/2011   TROPONINI <0.30 06/11/2013    BNP: No results found for this basename: PROBNP,  in the last 72 hours  D-Dimer: No results found for this basename: DDIMER,  in the last 72 hours  CBG:  Recent Labs  03/04/14 0820  GLUCAP 160*    Hemoglobin A1C: No results found for this basename: HGBA1C,  in the last 72 hours  Lipid Panel: No results found for this basename: CHOL, HDL, LDLCALC, TRIG, CHOLHDL, LDLDIRECT,  in the last 72 hours  Thyroid Function Tests: No results found for this basename: TSH, T4TOTAL, FREET4, T3FREE, THYROIDAB,  in the last 72 hours  Anemia Panel: No results found for this basename: VITAMINB12, FOLATE, FERRITIN, TIBC, IRON, RETICCTPCT,  in the last 72 hours  Coagulation: No results found for this basename: LABPROT, INR,  in the last 72 hours  Urine Drug Screen: Drugs of Abuse:     Component Value Date/Time   LABOPIA POSITIVE* 03/04/2014 1542   COCAINSCRNUR NONE DETECTED 03/04/2014 1542   LABBENZ NONE DETECTED 03/04/2014 1542   AMPHETMU NONE DETECTED 03/04/2014 1542   THCU NONE DETECTED 03/04/2014 1542   LABBARB NONE DETECTED 03/04/2014 1542    Alcohol Level: No results found for this basename: ETH,  in the last 72 hours  Urinalysis:    Component Value Date/Time   COLORURINE YELLOW  03/04/2014 [place] 03/04/2014 1041   LABSPEC 1.015 03/04/2014 1041   PHURINE 6.0 03/04/2014 [place] 03/04/2014 1041   HGBUR SMALL* 03/04/2014 [place] 03/04/2014 [place] 03/04/2014 [place] 03/04/2014 1041   UROBILINOGEN 0.2 03/04/2014 1041   NITRITE NEGATIVE 03/04/2014 [place] 03/04/2014 1041    Imaging results:  Ct Head Wo Contrast  03/04/2014   CLINICAL DATA:  NEAR SYNCOPE  EXAM: CT HEAD WITHOUT CONTRAST  TECHNIQUE: Contiguous axial images were obtained from the base of the skull through the vertex without intravenous contrast.  COMPARISON:  Head CT dated 02/05/2013  FINDINGS: No acute intracranial abnormality. Specifically, no hemorrhage, hydrocephalus, mass lesion, acute infarction, or significant intracranial injury. No acute calvarial abnormality. Stable areas of small vessel white matter ischemic changes. The visualized paranasal sinuses mastoid air cells are patent.  IMPRESSION: No acute intracranial abnormality. Stable small vessel white matter ischemic changes.   Electronically Signed   By: Margaree Mackintosh M.D.   On: 03/04/2014 12:06    EKG: EKG Interpretation  Date/Time:  Thursday March 04 2014 08:13:23 EDT Ventricular Rate:  73 PR Interval:  137 QRS Duration: 88 QT Interval:  385 QTC Calculation: 424 R Axis:   65 Text Interpretation:  Sinus rhythm Confirmed by WARD,  DO, KRISTEN ([id number]) on 03/04/2014 8:15:53 AM   Orders placed during the hospital encounter of 03/04/14  . EKG 12-LEAD  . EKG 12-LEAD  . EKG 12-LEAD     Antibiotics: Antibiotics Given (last 72 hours)   None      Anti-infectives   None      SIRS/Sepsis/Septic Shock criteria met:  No  Consults:    Assessment & Plan by Problem: Active Problems:   Near syncope  Syncope   Chronic headaches   CAD (coronary artery disease) of artery bypass graft   Bilateral carotid artery disease   Orthostatic  hypotension   Essential hypertension, benign   Hyperlipemia  Bradley Prince is a 53 y.o. male who has a past medical history of Coronary artery disease; Peripheral arterial disease (2012); Asthma, Anxiety, chronic HA, TIA; Hypertension; and Dyslipidemia.  Pt presents to the ED with a presyncopal episode while at work.    Orthostatic presyncope  Symptoms are c/w presyncope.  Pt experienced a presyncopal episode while standing and working in the heat. He admits that he does not stay well hydrated when working in the heat.  He has prior episodes and is supposed to follow-up with neurology.  Also with h/o seizure disorder.  Pt is positive for orthostatic hypotension likely as a result of dehydration and BP meds.  EKG shows NSR therefore cardiac etiology unlikely.  Aortic dissection unlikely as BP is equal in both arms and is without CP.  POCT in ED wnl.  Pt also with personality disorder which may also be contributing.  He received 1 L of NS in the ED.  -admit to observation on telemetry -hold home verapamil, lisinopril -received 1L NS bolus in ED -continue NS @150ml /h  -recheck orthostatics, EKG in the AM  Chronic headaches Pt follows with neurology at [facility].  He has had multiple hospital admissions at [facility] for HA.  Review of notes indicates he is on indomethacin, verapamil, and magnesium for HA.  However, he reports that nothing really helps his HA.  CT head in the ED was no acute abnormality.  Received percocet in the ED with mininal relief. -percocet q8h prn -defer management of HA to outpatient neurology  Hypertension  Stable.  -hold home meds of lisinopril 82m and verapamil 12759mCR tid until tomorrow.    Dyslipidemia On crestor 59m61maily. -atorvastatin 10 mg daily  Hematuria on microscopic UA -will need to follow-up with PCP  FEN  Fluids-NS 150m84mElectrolytes- Replete as needed Nutrition- Regular  VTE prophylaxis  lovenox 40mg36mly   Disposition Disposition  deferred at this time, awaiting improvement of current medical problems. Anticipated discharge in approximately 1-2 day(s).    Emergency Contact Contact Information   Name Relation Home Work Mobile   Scarpati,Teddi Spouse [phone] The patient does have a current PCP (Candice P ClaNaomie Dean and [facility].  Signed JacquJones BalesPGY-1, Internal Medicine Teaching Service [phone] Mon-Fri) 03/04/2014, 9:14 PM

## 2014-03-04 NOTE — ED Provider Notes (Signed)
Medical screening examination/treatment/procedure(s) were performed by non-physician practitioner and as supervising physician I was immediately available for consultation/collaboration.   EKG Interpretation   Date/Time:  Thursday March 04 2014 08:13:23 EDT Ventricular Rate:  73 PR Interval:  137 QRS Duration: 88 QT Interval:  385 QTC Calculation: 424 R Axis:   65 Text Interpretation:  Sinus rhythm Confirmed by WARD,  DO, KRISTEN [id number])  on 03/04/2014 8:15:53 AM        Layla MawKristen N Ward, DO 03/04/14 1539

## 2014-03-04 NOTE — Progress Notes (Signed)
Patient arrived to 2 west from ED. VS stable, patient complains of lingering headache. Attending MD paged per order to notify that patient has arrived to the floor.Stanton KidneyCURRY, Bradley R

## 2014-03-04 NOTE — ED Notes (Signed)
Attempted to ambulate pt. Pt unable to stand. Notified PA. Will continue to give fluids. Pt now wants medication for headache. PA will order meds and CT of head

## 2014-03-04 NOTE — ED Notes (Signed)
Pt was at work moving a tree. He became very dizzy. No LOC. Pt felt weak, has Headache and nauseous. Pt was able to sit down. Pt has hx of tremors that are getting worse. Pt has hx of AAA and open heart surgery. EMS gave 4mg  zofran. EKG unremarkable- SR- HR 70.  BP 162/80. CBG 170. Denies CP, SOB.

## 2014-03-04 NOTE — ED Notes (Signed)
Pt started itching after receiving morphine. No SOB. Notified PA and ordered benedryl

## 2014-03-04 NOTE — ED Notes (Signed)
Gave pt ice pack for headache

## 2014-03-04 NOTE — Progress Notes (Addendum)
Pt complaining of feeling itching all over after percocet. RN paged MD. Orders given for Benadryl. Pt currently resting in bed with call bell within reach. Will continue to monitor.   2221  Pt still complaining of itching. RN paged MD. New order given for vistaril. Pt verbalized relief and is now resting in bed with call bell within reach. Will continue to monitor.   Chesley Miresavis, Dreama R

## 2014-03-04 NOTE — ED Provider Notes (Signed)
CSN: [id number]     Arrival date & time 03/04/14  0800 History   First MD Initiated Contact with Patient 03/04/14 [id number]     Chief Complaint  Patient presents with  . 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 Patient states for the past several days he has been working outside with moving trees, and helping with yard maintenance at his work place.  Patient states he may have been outside in the sun for too long yesterday. He woke up this morning with a headache. Describe headaches as a throbbing sensation to the back of his head, persistent. He took a dose of indomethacin which provides minimal relief. He went to work and while moving a tree he felt lightheadedness and dizziness. He began to sweat, and felt nauseous. He was able to sit down and did not pass out, but had a near syncopal episode. His coworker called EMS. Patient received 4 mg of Zofran prior to arrival and states that it has helped with his nausea. At this time he denies any significant fever, neck stiffness, vision changes, Lioresal sensitivity, chest pain, shortness of breath, productive cough, abdominal pain, back pain, focal numbness, or rash. His last similar headache was a month and half ago.  Past Medical History  Diagnosis Date  . Asthma   . Anxiety   . Tobacco abuse   . Peripheral arterial occlusive disease 2012    s/p PTCA/stent  . Coronary artery disease   . Mental disorder     histronic personality disorder  . TIA (transient ischemic attack)   . Hypertension   . Hyperlipidemia    Past Surgical History  Procedure Laterality Date  . Renal artery stents    . Abdominal aortic aneurysm repair    . Cholecystectomy    . Appendectomy    . Coronary  artery bypass graft    . Tee without cardioversion N/A 06/11/2013    Procedure: TRANSESOPHAGEAL ECHOCARDIOGRAM (TEE);  Surgeon: Vesta MixerPhilip J Nahser, MD;  [facility];  Service: Cardiovascular;  Laterality: N/A;   Family History  Problem Relation Age of Onset  . Diabetes Mother   . Hypertension Father   . Pancreatic cancer Mother   . Alcohol abuse Father   . Alcohol abuse Brother   . Alcohol abuse Paternal Uncle    History  Substance Use Topics  . Smoking status: Current Every Day Smoker -- 0.50 packs/day for 15 years    Types: Cigarettes  . Smokeless tobacco: Not on file  . Alcohol Use: No     Comment: social    Review of Systems  All other systems reviewed and are negative.     Allergies  Ketorolac tromethamine; Penicillins; and Toradol  Home Medications   Prior to Admission medications   Medication Sig Start Date End Date Taking? Authorizing Provider  amLODipine (NORVASC) 10 MG tablet Take 10 mg by mouth daily.    Historical Provider, MD  aspirin 81 MG tablet Take 81 mg by mouth daily.    Historical Provider, MD  aspirin EC 81 MG tablet Take 81 mg by mouth daily.    Historical Provider, MD  atorvastatin (LIPITOR) 40 MG tablet Take 40 mg by mouth daily.    Historical Provider, MD  ezetimibe (ZETIA) 10 MG tablet Take 1 tablet (10 mg total) by mouth  daily. 06/12/13   Laveda Normanhris N Oti, MD  fluconazole (DIFLUCAN) 100 MG tablet Take 1 tablet (100 mg total) by mouth daily. 06/12/13   Laveda Normanhris N Oti, MD  HYDROcodone-acetaminophen (VICODIN) 5-500 MG per tablet Take 1 tablet by mouth every 6 (six) hours as needed. For pain    Historical Provider, MD  indomethacin (INDOCIN) 25 MG capsule Take 25 mg by mouth daily as needed (for headaches).    Historical Provider, MD  lisinopril (PRINIVIL,ZESTRIL) 20 MG tablet Take 20 mg by mouth daily.    Historical Provider, MD  losartan (COZAAR) 100 MG tablet Take 100 mg by mouth daily.    Historical Provider, MD  omeprazole (PRILOSEC) 40 MG capsule  Take 40 mg by mouth daily as needed (for acid reflux).     Historical Provider, MD  ondansetron (ZOFRAN) 4 MG tablet Take 4 mg by mouth every 8 (eight) hours as needed for nausea.    Historical Provider, MD  oxycodone (OXY-IR) 5 MG capsule Take 1 capsule (5 mg total) by mouth every 4 (four) hours as needed. 06/12/13   Laveda Normanhris N Oti, MD  UNABLE TO FIND Outpatient physiotherapy for persistent left-sided weakness. 06/12/13   Laveda Normanhris N Oti, MD  varenicline (CHANTIX) 0.5 MG tablet Take 0.5 mg by mouth 2 (two) times daily.    Historical Provider, MD  verapamil (CALAN) 40 MG tablet Take 40 mg by mouth 2 (two) times daily.    Historical Provider, MD   BP 143/81  Temp(Src) 97.9 F (36.6 C) (Oral)  Resp 18  Ht 5\' 11"  (1.803 m)  Wt 209 lb (94.802 kg)  BMI 29.16 kg/m2  SpO2 94% Physical Exam  Constitutional: He is oriented to person, place, and time. He appears well-developed and well-nourished. No distress.  HENT:  Head: Atraumatic.  Mouth with moist oral mucosa. Dentures noted   Eyes: Conjunctivae and EOM are normal. Pupils are equal, round, and reactive to light.  Neck: Normal range of motion. Neck supple.  No nuchal rigidity  Cardiovascular: Normal rate and regular rhythm.   Pulmonary/Chest: Effort normal and breath sounds normal. He exhibits no tenderness.  Abdominal: Soft. There is no tenderness.  No abdominal bruit  Musculoskeletal: He exhibits no edema.  Neurological: He is alert and oriented to person, place, and time. He has normal strength. GCS eye subscore is 4. GCS verbal subscore is 5. GCS motor subscore is 6.  5 out 5 strength to all 4 extremities  Skin: No rash noted.  Psychiatric: He has a normal mood and affect.    ED Course  Procedures (including critical care time)  8:36 AM Patient with near syncopal episode likely due to dehydration and heat exhaustion. He has no focal neuro deficit. He has no active chest pain shortness of breath abdominal pain concerning for AAA  complication. Headache is without red flags.  Work up initiated.  12:18 PM Labs is reassuring. Head CT without any acute abnormalities. Patient had been receiving fluid and pain medication for his headache with some improvement. He is however unable to ambulate and endorse lightheadedness each time he stands up. His vital signs are reassuring. He appears to be reasonable and given the fact that he is symptomatic with positional change, my plan to have patient admits of and are in near syncope. His EKG without heart block.  Normal Troponin.    1:28 PM ihave consulted with OPC, Dr. Gerlene Burdockichard who agrees to see and admit pt to obs for further care.  Attending doctor is Dr.  Mullen.  Pt is made aware of plan.    Labs  Review Labs Reviewed  URINALYSIS, ROUTINE W REFLEX MICROSCOPIC - Abnormal; Notable for the following:    Hgb urine dipstick SMALL (*)    All other components within normal limits  I-STAT CHEM 8, ED - Abnormal; Notable for the following:    Glucose, Bld 121 (*)    Calcium, Ion 1.25 (*)    All other components within normal limits  CBG MONITORING, ED - Abnormal; Notable for the following:    Glucose-Capillary 160 (*)    All other components within normal limits  CBC WITH DIFFERENTIAL  URINE MICROSCOPIC-ADD ON  POCT CBG (FASTING - GLUCOSE)-MANUAL ENTRY  I-STAT TROPOININ, ED    Imaging Review Ct Head Wo Contrast  03/04/2014   CLINICAL DATA:  NEAR SYNCOPE  EXAM: CT HEAD WITHOUT CONTRAST  TECHNIQUE: Contiguous axial images were obtained from the base of the skull through the vertex without intravenous contrast.  COMPARISON:  Head CT dated 02/05/2013  FINDINGS: No acute intracranial abnormality. Specifically, no hemorrhage, hydrocephalus, mass lesion, acute infarction, or significant intracranial injury. No acute calvarial abnormality. Stable areas of small vessel white matter ischemic changes. The visualized paranasal sinuses mastoid air cells are patent.  IMPRESSION: No acute  intracranial abnormality. Stable small vessel white matter ischemic changes.   Electronically Signed   By: Salome Holmes M.D.   On: 03/04/2014 12:06     EKG Interpretation   Date/Time:  Thursday March 04 2014 08:13:23 EDT Ventricular Rate:  73 PR Interval:  137 QRS Duration: 88 QT Interval:  385 QTC Calculation: 424 R Axis:   65 Text Interpretation:  Sinus rhythm Confirmed by WARD,  DO, KRISTEN ([id number])  on 03/04/2014 8:15:53 AM      MDM   Final diagnoses:  Near syncope    BP 153/87  Pulse 67  Temp(Src) 97.9 F (36.6 C) (Oral)  Resp 23  Ht 5\' 11"  (1.803 m)  Wt 209 lb (94.802 kg)  BMI 29.16 kg/m2  SpO2 98%  I have reviewed nursing notes and vital signs. I personally reviewed the imaging tests through PACS system  I reviewed available ER/hospitalization records thought the EMR     Fayrene Helper, PA-C 03/04/14 1329

## 2014-03-04 NOTE — ED Notes (Signed)
Notified RN of CBG 160

## 2014-03-05 LAB — TROPONIN I: Troponin I: <0.3 ng/mL (ref <0.30)

## 2014-03-05 LAB — GLUCOSE, CAPILLARY: GLUCOSE-CAPILLARY: 88 mg/dL (ref 70–99)

## 2014-03-05 MED ORDER — VERAPAMIL HCL ER 120 MG PO TBCR: 120.0000 mg | EXTENDED_RELEASE_TABLET | Freq: Every day | ORAL | Status: DC

## 2014-03-05 MED ORDER — CYCLOBENZAPRINE HCL 10 MG PO TABS: 10.0000 mg | ORAL_TABLET | Freq: Every evening | ORAL | Status: AC | PRN

## 2014-03-05 NOTE — Progress Notes (Signed)
Patient stated that MD told him that he can shower. Explained to patient that staff needs to be in the room while he showers due to his diagnosis and c/o dizziness. Patient refused to allow staff in room while he is showering. Patient advised that RN did not feel like he is steady enough to shower along and that he is a high fall risk. Patient continues to refuse to allow staff to be in room while he shower. Stanton KidneyCURRY, TEAL R

## 2014-03-05 NOTE — Discharge Summary (Signed)
Name: Bradley Prince MRN: [id number] DOB: 1960-11-28 53 y.o. PCP: Marcell Angerandice P Clark, NP _________________________________________________  Date of Admission: 03/04/2014  8:03 AM Date of Discharge: 03/05/2014 Attending Physician: Inez CatalinaEmily B Mullen, MD   Discharge Diagnosis: Orthostatic presyncope Chronic headaches Hypertension Dyslipidemia Microscopic hematuria  Discharge Medications:   Medication List         aspirin EC 81 MG tablet  Take 81 mg by mouth daily.     cyclobenzaprine 10 MG tablet  Commonly known as:  FLEXERIL  Take 1 tablet (10 mg total) by mouth at bedtime as needed for muscle spasms.     indomethacin 25 MG capsule  Commonly known as:  INDOCIN  Take 25 mg by mouth daily as needed (for headaches).     lisinopril 20 MG tablet  Commonly known as:  PRINIVIL,ZESTRIL  Take 20 mg by mouth daily.     rosuvastatin 5 MG tablet  Commonly known as:  CRESTOR  Take 5 mg by mouth daily.     verapamil 120 MG CR tablet  Commonly known as:  CALAN-SR  Take 1 tablet (120 mg total) by mouth daily.        Disposition and follow-up:   Bradley Prince [facility] to home.    Please address the following problems post-discharge:  1.Resolution of presyncope, pt may benefit from vestibular rehab? 2. Sinus bradycardia, verapamil was discontinued at hospital discharge due to possibly contributing to presyncope   3. Chronic headaches, follow-up with neurology 4. Microscopic hematuria 5. Recheck orthostatics, ensure compliance with adequate hydration   Labs / imaging needed at time of follow-up: Repeat urinalysis  Pending labs/ test needing follow-up: None  Follow-up Appointments: Follow-up Information   Schedule an appointment as soon as possible for a visit with Marcell Angerandice P Clark, NP.   Specialty:  Internal Medicine   Contact information:   [address] [place]  [id number] [phone]       Schedule an appointment as soon as possible for a visit with CRADDOCK, JESSICA R, MD.   Specialty:  Neurology   Contact information:   [address] [place] [id number] [phone]       Discharge Instructions: Discharge Instructions   Call MD for:  difficulty breathing, headache or visual disturbances    Complete by:  As directed      Call MD for:  extreme fatigue    Complete by:  As directed      Call MD for:  persistant dizziness or light-headedness    Complete by:  As directed      Diet - low sodium heart healthy    Complete by:  As directed      Increase activity slowly    Complete by:  As directed            Consultations:  None  Procedures Performed:  Ct Head Wo Contrast  03/04/2014   CLINICAL DATA:  NEAR SYNCOPE  EXAM: CT HEAD WITHOUT CONTRAST  TECHNIQUE: Contiguous axial images were obtained from the base of the skull through the vertex without intravenous contrast.  COMPARISON:  Head CT dated 02/05/2013  FINDINGS: No acute intracranial abnormality. Specifically, no hemorrhage, hydrocephalus, mass lesion, acute infarction, or significant intracranial injury. No acute calvarial abnormality. Stable areas of small vessel white matter ischemic changes. The visualized paranasal sinuses mastoid air cells are patent.  IMPRESSION: No acute intracranial abnormality. Stable small  vessel white matter ischemic changes.   Electronically Signed   By: Salome HolmesHector  Cooper M.D.   On: 03/04/2014 12:06    2D Echo: N/A  Cardiac Cath: N/A  Admission HPI:  Bradley Prince is a 53 y.o. male who has a past medical history of ?CAD; Peripheral arterial disease (2012); Asthma, Anxiety, chronic HA, TIA; Hypertension; and Dyslipidemia. Pt presents to the ED with a presyncopal episode while at work today. He works at [employer] and was standing out in the heat when he began to feel generalized weakness, lightheadedness, diaphoresis, and dizziness. He also endorses HA  and nausea but denies any fever/chills, changes in vision, SOB, CP, palpitations, vomiting, bladder or bowel incontinence, or tremors. He remembers the event and never lost consciousness nor felt confused afterward. He reports having several episodes similar to this in the past but no etiology was found. He was referred by his PCP to neurology for further evaluation of his presyncopal episodes but has not followed up. He denies any new medications or changes in current medications. He smokes about 1/2 ppd. He denies alcohol or recreational drug use. In the ED, he was given NS, zofran, and percocet for HA.   Hospital Course by problem list: Principal Problem:   Syncope Active Problems:   Near syncope   Chronic headaches   CAD (coronary artery disease) of artery bypass graft   Bilateral carotid artery disease   Orthostatic hypotension   Essential hypertension, benign   Hyperlipemia   Orthostatic presyncope  Symptoms are c/w presyncope. Pt experienced a presyncopal episode while standing and working in the heat. He admits that he does not stay well hydrated when working in the heat. He has prior episodes and is supposed to follow-up with neurology. Also with h/o seizure disorder but no s/s of seizure.  Pt denies LOC.  Pt is positive for orthostatic hypotension likely as a result of dehydration and BP meds. EKG shows NSR therefore cardiac etiology unlikely. Aortic dissection unlikely as BP is equal in both arms and is without CP. POCT in ED wnl. Pt also with personality disorder which may also be contributing. He was admitted for observation on telemetry.  He received 1 L of NS in the ED and was continued on NS 17150ml/h with resolution of orthostatic hypotension.  We held  home verapamil and lisinopril.  Lisinopril was resumed upon discharge but verapamil was discontinued due to sinus bradycardia which likely contributed to his presyncopal episode.     Chronic headaches  Pt follows with neurology at  [facility]. [facility]. Review of notes indicates he is on indomethacin, verapamil, and magnesium for HA. However, he reports that nothing really helps.  CT head in the ED was no acute abnormality. Received percocet in the ED with mininal relief.  He was given percocet q8h prn.  We will defer further management of HA to outpatient neurology.  Hypertension  Stable.  Plans per above.    Dyslipidemia  On crestor 5mg  daily.  Continued with formulary atorvastatin 10 mg daily.  Resumed crestor upon discharge.   Microscopic hematuria on microscopic UA  No urinary complaints.  Follow-up with PCP.     Discharge Vitals:   BP 126/81  Pulse 64  Temp(Src) 97.9 F (36.6 C) (Oral)  Resp 16  Ht 5\' 11"  (1.803 m)  Wt 208 lb 15.9 oz (94.8 kg)  BMI 29.16 kg/m2  SpO2 98%  Discharge Labs:  Results for orders placed during  the hospital encounter of 03/04/14 (from the past 24 hour(s))  URINE RAPID DRUG SCREEN (HOSP PERFORMED)     Status: Abnormal   Collection Time    03/04/14  3:42 PM      Result Value Ref Range   Opiates POSITIVE (*) NONE DETECTED   Cocaine NONE DETECTED  NONE DETECTED   Benzodiazepines NONE DETECTED  NONE DETECTED   Amphetamines NONE DETECTED  NONE DETECTED   Tetrahydrocannabinol NONE DETECTED  NONE DETECTED   Barbiturates NONE DETECTED  NONE DETECTED  TROPONIN I     Status: None   Collection Time    03/05/14 12:54 AM      Result Value Ref Range   Troponin I <0.30  <0.30 ng/mL  GLUCOSE, CAPILLARY     Status: None   Collection Time    03/05/14  7:35 AM      Result Value Ref Range   Glucose-Capillary 88  70 - 99 mg/dL    Signed: Marrian Salvage, MD 03/05/2014, 12:42 PM   Time Spent on Discharge: 35 minutes Services Ordered on Discharge: None Equipment Ordered on Discharge: None

## 2014-03-05 NOTE — Progress Notes (Signed)
Patient got up to walk to the bathroom this morning, patient made it to the bathroom door then reported to feel 'weak and light headed" Staff had to assist patient into a chair. BP= 143/82, HR=57. Staff stood patient up to escort him back to bed once he felt better, BP = 158/83, HR 57. Patient told RN that he still wants to go home. Encouraged him to wait to see what MD recommends. Patient back to bed, call light in reach, bed alarm on, and patient to call if he needs assistance, or wants to get up. Rise PaganiniURRY, TEAL R, RN

## 2014-03-05 NOTE — Progress Notes (Signed)
Subjective:   Day of hospitalization: 1  VSS.  No overnight events.  He still c/o dizziness while standing.  He is eating and drinking OK.  Pt is not orthostatic this AM but his HR is not responding appropriately to standing.    Objective:   Vital signs in last 24 hours: Filed Vitals:   03/04/14 1451 03/04/14 1500 03/04/14 2020 03/05/14 0533  BP: 143/88 130/94 149/86 126/81  Pulse: 57 61 67 64  Temp: 97.4 F (36.3 C)  97.6 F (36.4 C) 97.9 F (36.6 C)  TempSrc: Oral  Oral Oral  Resp: 16  18 16   Height: 5\' 11"  (1.803 m)     Weight: 208 lb 15.9 oz (94.8 kg)     SpO2: 97%  97% 98%    Weight: Filed Weights   03/04/14 0809 03/04/14 1451  Weight: 209 lb (94.802 kg) 208 lb 15.9 oz (94.8 kg)    Ins/Outs:  Intake/Output Summary (Last 24 hours) at 03/05/14 1324 Last data filed at 03/05/14 0900  Gross per 24 hour  Intake    120 ml  Output    575 ml  Net   -455 ml    Physical Exam: Constitutional: Vital signs reviewed.  Patient is sitting up in bed in no acute distress and cooperative with exam.   HEENT: [place]/AT; PERRL, EOMI, conjunctivae normal, no scleral icterus  Cardiovascular: RRR, no MRG Pulmonary/Chest: normal respiratory effort, no accessory muscle use, CTAB, no wheezes, rales, or rhonchi Abdominal: Soft. +BS, NT/ND Neurological: A&O x3, CN II-XII grossly intact; non-focal exam Extremities: 2+DP b/l, no C/C/E  Skin: Warm, dry and intact. No rash  Lab Results:  BMP:  Recent Labs Lab 03/04/14 0849  NA 142  K 4.5  CL 102  GLUCOSE 121*  BUN 10  CREATININE 0.90   CBC:  Recent Labs Lab 03/04/14 0834 03/04/14 0849  WBC 6.5  --   NEUTROABS 4.1  --   HGB 14.4 16.0  HCT 43.8 47.0  MCV 90.9  --   PLT 280  --    CBG:          Recent Labs Lab 03/04/14 0820 03/05/14 0735  GLUCAP 160* 88   Cardiac Enzymes:  Recent Labs Lab 03/05/14 0054  TROPONINI <0.30    EKG: EKG Interpretation  Date/Time:  Thursday March 04 2014 08:13:23  EDT Ventricular Rate:  73 PR Interval:  137 QRS Duration: 88 QT Interval:  385 QTC Calculation: 424 R Axis:   65 Text Interpretation:  Sinus rhythm Confirmed by WARD,  DO, KRISTEN [phone]([id number]) on 03/04/2014 8:15:53 AM  Urinalysis:  Recent Labs Lab 03/04/14 1041  COLORURINE YELLOW  LABSPEC 1.015  PHURINE 6.0  GLUCOSEU NEGATIVE  HGBUR SMALL*  BILIRUBINUR NEGATIVE  KETONESUR NEGATIVE  PROTEINUR NEGATIVE  UROBILINOGEN 0.2  NITRITE NEGATIVE  LEUKOCYTESUR NEGATIVE   Studies/Results: Ct Head Wo Contrast  03/04/2014   CLINICAL DATA:  NEAR SYNCOPE  EXAM: CT HEAD WITHOUT CONTRAST  TECHNIQUE: Contiguous axial images were obtained from the base of the skull through the vertex without intravenous contrast.  COMPARISON:  Head CT dated 02/05/2013  FINDINGS: No acute intracranial abnormality. Specifically, no hemorrhage, hydrocephalus, mass lesion, acute infarction, or significant intracranial injury. No acute calvarial abnormality. Stable areas of small vessel white matter ischemic changes. The visualized paranasal sinuses mastoid air cells are patent.  IMPRESSION: No acute intracranial abnormality. Stable small vessel white matter ischemic changes.   Electronically Signed   By: Salome HolmesHector  Cooper M.D.  On: 03/04/2014 12:06    Medications:  Scheduled Meds: . atorvastatin  10 mg Oral q1800  . enoxaparin (LOVENOX) injection  40 mg Subcutaneous Q24H   Continuous Infusions:   PRN Meds: oxyCODONE-acetaminophen  Antibiotics: Antibiotics Given (last 72 hours)   None      Day of Hospitalization: 1  Consults:    Assessment/Plan:   Principal Problem:   Syncope Active Problems:   Near syncope   Chronic headaches   CAD (coronary artery disease) of artery bypass graft   Bilateral carotid artery disease   Orthostatic hypotension   Essential hypertension, benign   Hyperlipemia  Orthostatic presyncope  Symptoms are c/w presyncope. Pt experienced a presyncopal episode while standing and  working in the heat. He admits that he does not stay well hydrated when working in the heat. He has prior episodes and is supposed to follow-up with neurology. Also with h/o seizure disorder. Pt is positive for orthostatic hypotension likely as a result of dehydration and BP meds. EKG shows NSR therefore cardiac etiology unlikely. Aortic dissection unlikely as BP is equal in both arms and is without CP. POCT in ED wnl. Pt also with personality disorder which may also be contributing. He received 1 L of NS in the ED and 150ml NS overnight.  He is no longer orthostatic.  This AM he still reports dizziness and telemetry shows some sinus bradycardia which is likely contributing.  EKG shows sinus bradycardia with a HR of 54.  We will continue to hold his verapamil upon discharge given his bradycardia but will continue his lisinopril.  May also benefit from vestibular rehab.    Chronic headaches  Pt follows with neurology at [facility]. He has [facility]. Review of notes indicates he is on indomethacin, verapamil, and magnesium for HA. However, he reports that nothing really helps his HA. CT head in the ED was no acute abnormality. Received percocet in the ED with mininal relief.  He reports he is taking verapamil for chronic HAs which we are discontinuing per above.    Hypertension  Stable. Will restart lisinopril 20mg  and discontinue verapamil per above.   Dyslipidemia  On crestor 5mg  daily.  Continue at discharge.   Microscopic hematuria on UA  Will need to follow-up with PCP.    FEN  Fluids-d/c NS 16350ml/h  Electrolytes- Replete as needed  Nutrition- Regular   VTE prophylaxis  lovenox 40mg  daily   F/E/N Fluids- d/c NS Electrolytes- Replete as needed Nutrition- Heart healthy diet  VTE PPx  lovenox 40mg  SQ qd  Disposition Anticipated discharge today.    LOS: 1 day   Marrian SalvageJacquelyn S Gill, MD PGY-1, Internal Medicine Teaching Service [phone] (7AM-5PM  Mon-Fri) 03/05/2014, 1:24 PM

## 2014-03-05 NOTE — Discharge Instructions (Signed)
Please call your PCP and schedule a follow up appointment as soon as possible.   It is important for you to stay hydrated especially when working outside in the hot weather.  Please do not drive until you have followed-up with your primary physician.   We have made the following additions/changes to your medications:  Please stop taking your verapamil.  This may be contributing to your dizziness.    You may also benefit from vestibular rehabilitation as this may improve your dizziness.  Also, be sure to take your time standing up and do not stand up too quickly.    Please continue to take all of your medications as prescribed.  Do not miss any doses without contacting your primary physician.  If you have questions, please contact your physician or contact the Internal Medicine Teaching Service at [phone].  Please bring your medicications with you to your appointments; medications may be eye drops, herbals, vitamins, or pills.    If you believe you are having a life-threatening emergency, go to the nearest Emergency Department.

## 2014-03-05 NOTE — Progress Notes (Signed)
Went over discharge instructions with patient. Also reinforced that patient is not to drive until after he follows up with a PCP. Patient expressed understanding of discharge instructions. Patient has no additional questions or concerns for staff. Patient discharged home with a friend. Bradley Prince, Bradley Prince

## 2014-03-05 NOTE — H&P (Signed)
  Date: 03/05/2014  Patient name: Bradley KailJoseph L Armand  Medical record number: [id number]  Date of birth: 05-26-1961   I have seen and evaluated Bradley Prince and discussed their care with the Residency Team.  Briefly, Mr. Dorris FetchHendrickson is a 53yo man with PMH of CAD, PAD, Asthma, Anxiety, chronic HA and HTN who presented with a near syncopal event at work.  He works at [employer] and was out in the heat working on the day of admission.  He admits to decreased PO, both food and liquid, intake that day.  While working, he began to feel generalized weakness, lightheadedness, diaphoresis and dizziness.  He reported previously no LOC, but now says maybe he was out "for a few seconds."  He did not fall or hit his head and endorses no post ictal symptoms.  Further symptoms include headache and nausea.  He has had similar episodes in the past, but no etiology was found.  He has been referred to Neurology for this and chronic headaches but has not followed up.  His verapamil was increased from 120mg  once per day to 120mg  TID in the last week.  He has been somewhat bradycardic on vitals check.  Telemetry shows bradycardia in the 50s on average.  On exam, his neuro exam is non focal, but he did become dizzy upon going from a lying to a sitting position.  He was orthostatic in the ED, but is no longer orthostatic.    Assessment and Plan: I have seen and evaluated the patient as outlined above. I agree with the formulated Assessment and Plan as detailed in the residents' admission note, with the following changes:   1. Pre-syncope: Likely a combination of orthostatic hypotension and bradycardia - IVF as received overnight - Recheck orthostatics - CT head as per resident note - Hold verapamil, hold on discharge as well until seen by PCP - Can restart lisinopril as renal function is stable.   2. Chronic headaches - percocet prn  3. Other issues per resident note.   Inez CatalinaEmily B Miyonna Ormiston, MD 6/19/201512:42 PM

## 2014-03-05 NOTE — Progress Notes (Signed)
UR Completed Brenda Graves-Bigelow, RN,BSN [phone]  

## 2014-03-09 NOTE — Discharge Summary (Signed)
I saw Bradley Prince on day of discharge and assisted in the discharge planning.

## 2014-07-19 ENCOUNTER — Observation Stay: Payer: Self-pay | Admitting: Internal Medicine

## 2014-07-19 LAB — URINALYSIS, COMPLETE
BILIRUBIN, UR: NEGATIVE
Bacteria: NONE SEEN
GLUCOSE, UR: NEGATIVE mg/dL (ref 0–75)
Ketone: NEGATIVE
Leukocyte Esterase: NEGATIVE
Nitrite: NEGATIVE
PH: 6 (ref 4.5–8.0)
Protein: NEGATIVE
RBC,UR: 5 /HPF (ref 0–5)
Specific Gravity: 1.012 (ref 1.003–1.030)
Squamous Epithelial: NONE SEEN

## 2014-07-19 LAB — TROPONIN I

## 2014-07-19 LAB — COMPREHENSIVE METABOLIC PANEL
ALBUMIN: 3.5 g/dL (ref 3.4–5.0)
ALK PHOS: 143 U/L — AB
AST: 19 U/L (ref 15–37)
Anion Gap: 8 (ref 7–16)
BUN: 10 mg/dL (ref 7–18)
Bilirubin,Total: 0.2 mg/dL (ref 0.2–1.0)
Calcium, Total: 8.4 mg/dL — ABNORMAL LOW (ref 8.5–10.1)
Chloride: 105 mmol/L (ref 98–107)
Co2: 27 mmol/L (ref 21–32)
Creatinine: 0.95 mg/dL (ref 0.60–1.30)
EGFR (Non-African Amer.): >60
Glucose: 130 mg/dL — ABNORMAL HIGH (ref 65–99)
POTASSIUM: 3.8 mmol/L (ref 3.5–5.1)
SGPT (ALT): 29 U/L
Sodium: 140 mmol/L (ref 136–145)
TOTAL PROTEIN: 7.1 g/dL (ref 6.4–8.2)

## 2014-07-19 LAB — CBC WITH DIFFERENTIAL/PLATELET
Basophil #: 0.1 10*3/uL (ref 0.0–0.1)
Basophil %: 0.7 %
EOS ABS: 0.1 10*3/uL (ref 0.0–0.7)
Eosinophil %: 1 %
HCT: 46.8 % (ref 40.0–52.0)
HGB: 15.2 g/dL (ref 13.0–18.0)
LYMPHS ABS: 2.3 10*3/uL (ref 1.0–3.6)
LYMPHS PCT: 22.8 %
MCH: 29.7 pg (ref 26.0–34.0)
MCHC: 32.4 g/dL (ref 32.0–36.0)
MCV: 92 fL (ref 80–100)
Monocyte #: 0.8 x10 3/mm (ref 0.2–1.0)
Monocyte %: 8.2 %
NEUTROS PCT: 67.3 %
Neutrophil #: 6.8 10*3/uL — ABNORMAL HIGH (ref 1.4–6.5)
Platelet: 378 10*3/uL (ref 150–440)
RBC: 5.1 10*6/uL (ref 4.40–5.90)
RDW: 13.8 % (ref 11.5–14.5)
WBC: 10.1 10*3/uL (ref 3.8–10.6)

## 2014-07-19 LAB — PROTIME-INR
INR: 1
Prothrombin Time: 12.9 secs (ref 11.5–14.7)

## 2014-07-19 LAB — APTT: Activated PTT: 29.4 secs (ref 23.6–35.9)

## 2014-07-20 ENCOUNTER — Ambulatory Visit: Payer: Self-pay | Admitting: Neurology

## 2014-07-20 DIAGNOSIS — I6789 Other cerebrovascular disease: Secondary | ICD-10-CM

## 2014-07-20 LAB — LIPID PANEL
Cholesterol: 198 mg/dL (ref 0–200)
HDL Cholesterol: 32 mg/dL — ABNORMAL LOW (ref 40–60)
LDL CHOLESTEROL, CALC: 133 mg/dL — AB (ref 0–100)
Triglycerides: 166 mg/dL (ref 0–200)
VLDL Cholesterol, Calc: 33 mg/dL (ref 5–40)

## 2014-07-20 LAB — CBC WITH DIFFERENTIAL/PLATELET
BASOS PCT: 0.1 %
Basophil #: 0 10*3/uL (ref 0.0–0.1)
EOS ABS: 0 10*3/uL (ref 0.0–0.7)
EOS PCT: 0.1 %
HCT: 44.8 % (ref 40.0–52.0)
HGB: 14.9 g/dL (ref 13.0–18.0)
Lymphocyte #: 0.5 10*3/uL — ABNORMAL LOW (ref 1.0–3.6)
Lymphocyte %: 9.8 %
MCH: 30.4 pg (ref 26.0–34.0)
MCHC: 33.3 g/dL (ref 32.0–36.0)
MCV: 91 fL (ref 80–100)
MONO ABS: 0 x10 3/mm — AB (ref 0.2–1.0)
MONOS PCT: 0.8 %
Neutrophil #: 4.6 10*3/uL (ref 1.4–6.5)
Neutrophil %: 89.2 %
PLATELETS: 360 10*3/uL (ref 150–440)
RBC: 4.91 10*6/uL (ref 4.40–5.90)
RDW: 13.7 % (ref 11.5–14.5)
WBC: 5.2 10*3/uL (ref 3.8–10.6)

## 2014-07-20 LAB — BASIC METABOLIC PANEL
Anion Gap: 7 (ref 7–16)
BUN: 10 mg/dL (ref 7–18)
CALCIUM: 8.5 mg/dL (ref 8.5–10.1)
CO2: 24 mmol/L (ref 21–32)
CREATININE: 0.97 mg/dL (ref 0.60–1.30)
Chloride: 107 mmol/L (ref 98–107)
EGFR (African American): >60
EGFR (Non-African Amer.): >60
Glucose: 206 mg/dL — ABNORMAL HIGH (ref 65–99)
Osmolality: 281 (ref 275–301)
Potassium: 4.6 mmol/L (ref 3.5–5.1)
Sodium: 138 mmol/L (ref 136–145)

## 2014-07-20 LAB — TSH: THYROID STIMULATING HORM: 0.307 u[IU]/mL — AB

## 2014-07-20 LAB — T4, FREE: Free Thyroxine: 1.21 ng/dL (ref 0.76–1.46)

## 2014-08-26 ENCOUNTER — Encounter (HOSPITAL_COMMUNITY): Payer: Self-pay | Admitting: Cardiology

## 2014-11-02 ENCOUNTER — Observation Stay: Payer: Self-pay | Admitting: Internal Medicine

## 2014-11-15 ENCOUNTER — Emergency Department: Payer: Self-pay | Admitting: Emergency Medicine

## 2015-01-08 NOTE — Discharge Summary (Signed)
PATIENT NAME:  Bradley Prince, Laterrance MR#:  [id number] DATE OF BIRTH:  04/09/1961  DATE OF ADMISSION:  07/19/2014 DATE OF DISCHARGE:  07/21/2014  DISCHARGE DIAGNOSES:  1.  Hemiplegia, complicated migraine.  2.  Hypertension.  3.  Depression.   CONSULTS: Pauletta BrownsYuriy Zeylikman, MD with neurology.   IMAGING STUDIES: CT scan of the head without contrast, which showed chronic microvascular ischemic changes, age-related atrophy, no acute findings.   CTA of the head showed no aneurysm, stenosis, bleeding or stroke.   MRI of the brain showed no acute stroke. It did show nonspecific white matter changes from chronic disease.   Doppler showed no significant stenosis.   Echocardiogram showed ejection fraction of 65%, no source for embolism.   ADMITTING HISTORY PHYSICAL: Please see detailed H and P dictated by Dr. Elisabeth PigeonVachhani. In brief, a 54 year old male patient with prior history of mild left-sided weakness from his migraines who follows [facility] in [place], presented to the hospital with 4 days of severe headache and worsening left-sided weakness. The patient had a CT scan of the head done with no acute changes, was admitted to the hospitalist service.   HOSPITAL COURSE: Worsening left-sided weakness headache. The patient was admitted on the telemetry floor for concern for a stroke, had MRI of the brain done which was normal. Also, had a CTA of the head done, which showed no aneurysm, stenosis or stroke. Was seen by neurology, Dr. Loretha BrasilZeylikman, who suggested this is a complicated migraine. The patient does have a history of hemiplegic migraines. He is being started on Elavil, magnesium oxide and B complex tablets with neurology recommendations and he is to follow up with the [facility] that he follows with in [place]. The patient's other comorbidities have been stable during the hospital stay.   Prior to discharge, the patient has mild weakness on the left side, although his testing is a little  variable. Heart sounds are S1, S2. Lungs clear.   DISCHARGE MEDICATIONS:  1.  Aspirin 81 mg daily.  2.  Lisinopril 20 mg daily.  4.  Lexapro 20 mg daily.  5.  ProAir HFA 2 puffs inhaled 4 times a day as needed.  6.  Amitriptyline 10 mg daily.  7.  Magnesium oxide 500 mg oral once a day.  8.  B complex with vitamin B12 one tablet daily.   DISCHARGE INSTRUCTIONS: Low-sodium diet. Activity as tolerated. Follow up with neurologist in [place] at the [facility]. The patient has been set up with PT and OT as outpatient.   TIME SPENT ON DAY OF DISCHARGE IN DISCHARGE ACTIVITY: Forty minutes.    ____________________________ Molinda BailiffSrikar R. Ange Puskas, MD srs:TT D: 07/22/2014 14:19:02 ET T: 07/22/2014 15:00:20 ET JOB#: [id number]  cc: Wardell HeathSrikar R. Ione Sandusky, MD, <Dictator> Orie FishermanSRIKAR R Navika Hoopes MD ELECTRONICALLY SIGNED 08/11/2014 8:55

## 2015-01-08 NOTE — Consult Note (Signed)
PATIENT NAME:  Bradley Prince, Bradley Prince DATE OF BIRTH:  Jul 26, 1961  DATE OF CONSULTATION:  07/20/2014  CONSULTING PHYSICIAN:  Bradley BrownsYuriy Loula Marcella, MD  HISTORY OF PRESENT ILLNESS: A pleasant gentleman with past medical history of headaches x 2 years. being followed up with neurology in [place]. Presented with worsening headache.  The patient states he was at work and had a questionable syncope versus seizure-like activity. The patient woke up with increasing headache which brought him up to the hospital along with left-sided weakness that has been improving. Current headache about 4 days along with left-sided weakness for about 1 day. For evaluation and treatment of the headache, the patient is status post Solu-Medrol 60 mg, 2 grams of magnesium, and status post Benadryl.   The patient is status post CAT scan, MRI, no acute intracranial abnormality. There was questionable small vessel disease and white matter changes on the MRI. Still complaining of diffuse pressure-like headache located on the left side of the brain and spreads diffusely. States the headaches are more frequent about 3-4 times a week. No nausea. No vomiting, but positive photophobia and phonophobia.   PAST MEDICAL HISTORY: Migraine headaches, hypertension, right renal stenosis, dyslipidemia, abdominal aortic repair, cholecystectomy, appendectomy.   HOME MEDICATIONS: Include aspirin, lisinopril, acetazolamide.   REVIEW OF SYSTEMS: Complaining of generalized headache. No blurry vision. No double vision. Positive weakness on the left side. No chest pain. No abdominal pain. No anxiety. No depression.   PHYSICAL EXAMINATION:  VITAL SIGNS:  Include a temperature of 98.7, pulse 69, respirations 20, blood pressure 164/89, pulse oximetry 95% on room air.  NEUROLOGICAL EVALUATION:  The patient complains of diffuse pressure-like headache, mostly starting on the left side. Speech appears to be fluent. Extraocular movements intact. Facial  sensation intact. Facial motor is intact. Drift left upper extremity, left lower extremity has improved. Sensation intact to light touch and temperature. Coordination: Finger-to-nose intact. Gait could not be assessed.   IMPRESSION: A gentleman with a 4 day history of headache along with left-sided weakness that has been improving.  The patient states that he has been having increased headaches for the past 2 years, now about 3-4 times a week. I believe this is a complicated migraine. The patient has not been treated for migraine symptoms as an outpatient.   PLAN: The patient had a CT and MRI. MRI showed white matter changes, no acute ischemic events. At this point, I ordered CT angiogram of his head to make sure there is no aneurysm that we could have possibly missed with this severe headache. I have given him 1 gram of magnesium. The patient is getting Decadron 10 mg x 1 at this point which should relieve his headaches. As an outpatient, I think patient will need daily headache prevention. Would consider Elavil 10 mg nightly. The patient should also be on magnesium 500 mg daily p.o., which is an over-the-counter supplement, and vitamin B complex 1 tab supplement daily. Once the headache symptoms resolve and CTA is negative, I think this patient can be discharged with followup with his neurologist in [place].   Thank you for the pleasure of seeing this patient. Please call with any questions.   ____________________________ Bradley BrownsYuriy Alioune Hodgkin, MD yz:LT D: 07/20/2014 15:19:00 ET T: 07/20/2014 18:03:36 ET JOB#: [id number]  cc: Bradley BrownsYuriy Zamoria Boss, MD, <Dictator> Bradley BrownsYURIY Lavin Petteway MD ELECTRONICALLY SIGNED 07/21/2014 12:33

## 2015-01-08 NOTE — H&P (Signed)
Subjective/Chief Complaint headachex 4 days, left sided weakness for 1 day,   History of Present Illness Hx of migraine headaches - followes with neurologist at [facility]. Had episodes of seizures 2 times in last 2 mths- not seen neurologist after that.  Last 4 days severe migrained headache- tried indomethacin, oxycontin, tylenol- no help. having enough sleep, not drinking excessive coffee.  2 days ago at work place- episode of blacked out while sitting in chair - for 2-4 min- and as per people around him- had seizures.  Today headache continues- and hae left sided body weakness, and face weak on left side. still present. so came to ER.   Past History Kline felter's synd Migraine headache Hypertension Right renal artery stenosis- s/p stent Dyslipidemia Aortic aneurysm repair Cholecystectomy Appendectomy   Primary Physician Candes Clerk   Code Status Full Code   Past Med/Surgical Hx:  Anxiety:   Tobacco Use:   Klinefelter's Syndrome:   HTN:   aortic aneurysm with repair:   Cholecystectomy:   Appendectomy:   ALLERGIES:  Ketorolac: Anaphylaxis  Penicillin: Itching  HOME MEDICATIONS: Medication Instructions Status  aspirin 81 mg oral tablet 1 tab(s) orally once a day Active  lisinopril 20 mg oral tablet 1 tab(s) orally once a day Active  acetaZOLAMIDE 500 milligram(s) orally 2 times a day Active  Lexapro 20 mg oral tablet 1 tab(s) orally once a day Active  ProAir HFA CFC free 90 mcg/inh inhalation aerosol 2 puff(s) inhaled 4 times a day, As Needed Active   Family and Social History:  Family History Coronary Artery Disease  Cancer  mother- Pancreatic cancer and CAD, Father- Alzheimer's disease.   Social History positive  tobacco, positive  tobacco (Current within 1 year), negative ETOH, negative Illicit drugs   Place of Living Home   Review of Systems:  Subjective/Chief Complaint headache, weakness   Fever/Chills No   Cough No   Sputum No   Abdominal Pain No    Diarrhea No   Constipation No   Nausea/Vomiting No   SOB/DOE No   Chest Pain No   Dysuria No   Tolerating PT Yes   Tolerating Diet Yes   Medications/Allergies Reviewed Medications/Allergies reviewed   Physical Exam:  GEN well developed, no acute distress   HEENT pink conjunctivae, PERRL, hearing intact to voice, moist oral mucosa   NECK supple  No masses  thyroid not tender  trachea midline   RESP normal resp effort  clear BS  no use of accessory muscles   CARD regular rate  no murmur  No LE edema   ABD denies tenderness  no liver/spleen enlargement  soft  normal BS  no Adominal Mass   EXTR negative cyanosis/clubbing, negative edema   SKIN normal to palpation, No rashes, skin turgor good   NEURO cranial nerves deficit, negative rigidity, negative tremor, L side weakness, follows commands, left side facial weakness, Power left upper and lower limb- 3/5, right side 5/5   PSYCH alert, A+O to time, place, person, good insight   Lab Results: Hepatic:  02-Nov-15 17:13   Bilirubin, Total 0.2  Alkaline Phosphatase  143 (46-116 NOTE: New Reference Range 04/06/14)  SGPT (ALT) 29 (14-63 NOTE: New Reference Range 04/06/14)  SGOT (AST) 19  Total Protein, Serum 7.1  Albumin, Serum 3.5  Routine Chem:  02-Nov-15 17:13   Glucose, Serum  130  BUN 10  Creatinine (comp) 0.95  Sodium, Serum 140  Potassium, Serum 3.8  Chloride, Serum 105  CO2, Serum 27  Calcium (Total), Serum  8.4  eGFR (African American) >60  eGFR (Non-African American) >60 (eGFR values <68m/min/1.73 m2 may be an indication of chronic kidney disease (CKD). Calculated eGFR, using the MRDR Study equation, is useful in  patients with stable renal function. The eGFR calculation will not be reliable in acutely ill patients when serum creatinine is changing rapidly. It is not useful in patients on dialysis. The eGFR calculation may not be applicable to patients at the low and high extremes of body  sizes, pregnant women, and vegetarians.)  Anion Gap 8  Cardiac:  02-Nov-15 17:13   Troponin I < 0.02 (0.00-0.05 0.05 ng/mL or less: NEGATIVE  Repeat testing in 3-6 hrs  if clinically indicated. >0.05 ng/mL: POTENTIAL  MYOCARDIAL INJURY. Repeat  testing in 3-6 hrs if  clinically indicated. NOTE: An increase or decrease  of 30% or more on serial  testing suggests a  clinically important change)  Routine Coag:  02-Nov-15 17:13   Prothrombin 12.9  INR 1.0 (INR reference interval applies to patients on anticoagulant therapy. A single INR therapeutic range for coumarins is not optimal for all indications; however, the suggested range for most indications is 2.0 - 3.0. Exceptions to the INR Reference Range may include: Prosthetic heart valves, acute myocardial infarction, prevention of myocardial infarction, and combinations of aspirin and anticoagulant. The need for a higher or lower target INR must be assessed individually. Reference: The Pharmacology and Management of the Vitamin K  antagonists: the seventh ACCP Conference on Antithrombotic and Thrombolytic Therapy. CYKZLD.3570Sept:126 (3suppl): [id number] A HCT value >55% may artifactually increase the PT.  In one study,  the increase was an average of 25%. Reference:  "Effect on Routine and Special Coagulation Testing Values of Citrate Anticoagulant Adjustment in Patients with High HCT Values." American Journal of Clinical Pathology 2006;126:400-405.)  Activated PTT (APTT) 29.4 (A HCT value >55% may artifactually increase the APTT. In one study, the increase was an average of 19%. Reference: "Effect on Routine and Special Coagulation Testing Values of Citrate Anticoagulant Adjustment in Patients with High HCT Values." American Journal of Clinical Pathology 2006;126:400-405.)  Routine Hem:  02-Nov-15 17:13   WBC (CBC) 10.1  RBC (CBC) 5.10  Hemoglobin (CBC) 15.2  Hematocrit (CBC) 46.8  Platelet Count (CBC) 378  MCV 92   MCH 29.7  MCHC 32.4  RDW 13.8  Neutrophil % 67.3  Lymphocyte % 22.8  Monocyte % 8.2  Eosinophil % 1.0  Basophil % 0.7  Neutrophil #  6.8  Lymphocyte # 2.3  Monocyte # 0.8  Eosinophil # 0.1  Basophil # 0.1 (Result(s) reported on 19 Jul 2014 at 05:34PM.)   Radiology Results: CT:    02-Nov-15 17:28, CT Head Without Contrast  CT Head Without Contrast  REASON FOR EXAM:    left sided weakness  COMMENTS:       PROCEDURE: CT  - CT HEAD WITHOUT CONTRAST  - Jul 19 2014  5:28PM     CLINICAL DATA:  Left-sided weakness starting 5 days ago. Seizure  last Friday.    EXAM:  CT HEAD WITHOUT CONTRAST    TECHNIQUE:  Contiguous axial images were obtained from the base of the skull  through the vertex without intravenous contrast.  COMPARISON:  02/25/2011 and MRI of 02/25/2011.    FINDINGS:  Sinuses/Soft tissues: Clear paranasal sinuses and mastoid air cells.    Intracranial: Moderate low density in the periventricular white  matter likely related to small vessel disease. This is age advanced  and slightly progressive since the prior exam. No mass lesion,  hemorrhage, hydrocephalus, acute infarct, intra-axial, or  extra-axial fluid collection.     IMPRESSION:  1. Periventricular white matter hypoattenuation, likely related to  moderate and age advanced small vessel ischemic change.  2. No acute superimposed process.  Electronically Signed    By: Abigail Miyamoto M.D.    On: 07/19/2014 17:32         Verified By: Areta Haber, M.D.,    Assessment/Admission Diagnosis * Migraine headache * TIA/ Complicated Migraine * Seizures/ Complicated migraine * Hypertension * Hyperlipidemia * Smoking   Plan 1) Migraine   IV reglan, Oral benadryl, Fioricet, IV solumedrol and magnesium   neurology consult.  2) TIA Vs migraine   MRI,. Echo, carotid doppler, PT eval , Neuro consult   Lipid, ASA, Statin  3) seizures Vs complicated migraine    No meds now, Wait for neuro eval.  4)  Hypertension   Con home meds  5) Hyperlipidemia    Statin  6) Smoking   cessation councelling done for 4 min.  Total time in admission spent 50 min.   Electronic Signatures: Vaughan Basta (MD)  (Signed [phone] 19:51)  Authored: CHIEF COMPLAINT and HISTORY, PAST MEDICAL/SURGIAL HISTORY, ALLERGIES, HOME MEDICATIONS, FAMILY AND SOCIAL HISTORY, REVIEW OF SYSTEMS, PHYSICAL EXAM, LABS, Radiology, ASSESSMENT AND PLAN   Last Updated: 02-Nov-15 19:51 by Vaughan Basta (MD)

## 2015-01-16 NOTE — Discharge Summary (Signed)
PATIENT NAME:  Bradley Prince, Bradley Prince MR#:  [id number] DATE OF BIRTH:  [birth year]  DATE OF ADMISSION:  11/02/2014 DATE OF DISCHARGE:  11/02/2014  ADMITTING DIAGNOSIS: Chest pain.   DISCHARGE DIAGNOSES:  1.  Chest pain felt to be atypical in nature following negative cardiac enzymes x 3. The patient did not want to have a stress test.   2.  Hypertension.  3.  History of renal artery stenosis, status post repair.  4.  History of thoracic aortic aneurysm, status post repair.  5.  Klinefelter syndrome.  6.  Hyperlipidemia, specified.   CONSULTANTS: None.   LABORATORY AND DIAGNOSTIC STUDIES: EKG: Normal sinus rhythm without any ST-T wave changes. Admitting glucose 147, BUN 10, creatinine 0.87, sodium 138, potassium 3.9, chloride 106, calcium 8.6. Cholesterol was 202, triglycerides 404, HDL is 31. LFTs were normal except alkaline phosphatase of 159. Troponin less than 0.02 x 3. WBC count was 13.1, hemoglobin 15.8. INR was 0.9.   HOSPITAL COURSE: Please refer to H and P done by the admitting physician. The patient is a 54 year old white male who presented with complaint of having chest pain. His evaluation in the ED was negative; however, due to his symptoms, he was admitted under observation. The patient's enzymes remained negative. I went to see the patient and, by that time, he had not eaten so I recommended a stress test. He was not interested in getting a stress test. He does have a cardiologist that he sees her as outpatient; I recommended that he follow up with them. At this time is stable for discharge.   DISCHARGE MEDICATIONS: ProAir 2 puffs 4 times a day as needed, acetazolamide 50 one tablet p.o. b.i.d., Lexapro 20 daily, lisinopril 20 daily, magnesium oxide 500 mg 1 tablet p.o. daily, Crestor 10 daily, omeprazole 20 daily, cyclobenzaprine 10 mg 1 tablet p.o. t.i.d. p.r.n., nitroglycerin 0.4 sublingual q. 5 minutes as needed, aspirin 81 mg 1 tablet p.o. daily.   DIET: Low-sodium, low-fat,  low-cholesterol.   ACTIVITY: As tolerated.   FOLLOWUP: Primary cardiologist in 1-2 weeks.    TIME SPENT: 35 minutes on this patient.     ____________________________ Lacie ScottsShreyang H. Allena KatzPatel, MD shp:bm D: 11/02/2014 20:58:00 ET T: 11/03/2014 05:45:39 ET JOB#: [id number]  cc: Aixa Corsello H. Allena KatzPatel, MD, <Dictator> Charise CarwinSHREYANG H Shatavia Santor MD ELECTRONICALLY SIGNED 11/05/2014 15:55

## 2015-01-16 NOTE — H&P (Signed)
PATIENT NAME:  Bradley Prince, CORELLA MR#:  [id number] DATE OF BIRTH:  [birth year]  DATE OF ADMISSION:  11/02/2014  REFERRING PHYSICIAN:  Loraine Leriche R. Fanny Bien, MD  PRIMARY CARE PHYSICIAN:  Nonlocal out of [place].   CARDIOLOGIST:  Out of [place] as well.   CHIEF COMPLAINT:  Chest pain.   HISTORY OF PRESENT ILLNESS:  This is a 54 year old Caucasian gentleman with a history of coronary artery disease without history of stent placement, renal artery stenosis status post stent placement, and thoracic aortic aneurysm status post repair, presenting with chest pain. He describes acute onset of chest pain around 4:00 p.m., which occurred at rest, retrosternal in location, pressure in quality, and 10 out of 10 in intensity with radiation to the neck as well as the left arm with associated shortness of breath and nausea. He took nitroglycerin x2 with no relief. The pain now is 7 out of 10. He presented to the hospital for continuation of symptoms.  REVIEW OF SYSTEMS: CONSTITUTIONAL:  Denies fevers, chills, fatigue, or weakness.  EYES:  Denies blurred vision, double vision, or eye pain.  EARS, NOSE, AND THROAT:  Denies tinnitus, ear pain, or hearing loss. RESPIRATORY:  Denies cough. Positive for shortness of breath. Denies wheezing.  CARDIOVASCULAR:  Positive for chest pain as described above. Denies orthopnea or edema.  GASTROINTESTINAL:  Positive for nausea. Denies any vomiting, diarrhea, or abdominal pain.  GENITOURINARY:  Denies dysuria or hematuria.  ENDOCRINE:  Denies nocturia or thyroid problems. HEMATOLOGIC AND LYMPHATIC:  Denies easy bruising or bleeding.  SKIN:  Denies rash or lesion.  MUSCULOSKELETAL:  Denies pain in the neck, back, shoulders, knees, or hips or arthritic symptoms.  NEUROLOGIC:  Denies paralysis or paresthesias.  PSYCHIATRIC:  Denies anxiety or depressive symptoms.   Otherwise, full review of systems performed by me is negative.  PAST MEDICAL HISTORY:  Includes hypertension, renal  artery stenosis status post repair, thoracic aortic aneurysm status post repair, Klinefelter syndrome, hyperlipidemia unspecified, and coronary artery disease.   SOCIAL HISTORY:  Positive for continued tobacco use. Denies any alcohol or drug use.  FAMILY HISTORY:  Positive for coronary artery disease.  ALLERGIES:  KETOROLAC AS WELL AS PENICILLIN.   HOME MEDICATIONS:  Include aspirin 81 mg p.o. daily, lisinopril 20 mg p.o. daily, Nitrostat 0.4 mg sublingually every 5 minutes as needed for chest pain, escitalopram 20 mg p.o. daily, Crestor 10 mg p.o. daily, ProAir 90 mcg inhalation 2 puffs 4 times daily as needed for shortness of breath, acetazolamide 500 mg p.o. b.i.d., magnesium oxide 500 mg p.o. daily, Flexeril 10 mg p.o. 3 times a day as needed for muscle spasm, Prilosec 20 mg p.o. daily.   PHYSICAL EXAMINATION: VITAL SIGNS:  Temperature 98.2, heart rate 66, respirations 16, blood pressure 139/87, and saturating 99% on supplemental O2. Weight 97.5 kg, BMI 30.  GENERAL:  Well-nourished, well-developed, Caucasian gentleman currently in no acute distress.  HEAD:  Normocephalic, atraumatic.  EYES:  Pupils are equal, round, and reactive to light. Extraocular muscles are intact. No scleral icterus.  MOUTH:  Moist mucous membranes. Dentition is intact. No abscess noted.  EARS, NOSE, AND THROAT:  Clear without exudates. No external lesions.  NECK:  Supple. No thyromegaly. No nodules. No JVD.  PULMONARY:  Clear to auscultation bilaterally without wheezes, rales, or rhonchi. No use of accessory muscles. Good respiratory effort.  CHEST:  Nontender to palpation.  CARDIOVASCULAR:  S1 and S2, regular rate and rhythm. No murmurs, rubs, or gallops. No edema. Pedal pulses 2+ bilaterally.  GASTROINTESTINAL:  Soft, nontender, nondistended. No masses. Positive bowel sounds. No hepatosplenomegaly.  MUSCULOSKELETAL:  No swelling, clubbing, or edema. Range of motion is full in all extremities.  NEUROLOGIC:   Cranial nerves II through XII  are intact. No gross focal neurological deficits. Sensation is intact. Reflexes are intact.  SKIN:  No ulcerations, lesions, rashes, or cyanosis. Skin is warm and dry. Turgor is intact.  PSYCHIATRIC:  Mood and affect are within normal limits. The patient is awake, alert, and oriented x 3. Insight and judgement are intact.  LABORATORY DATA:  Chest x-ray performed, which reveals no acute cardiopulmonary process. CT angiogram of the chest is negative for aortic dissection or aneurysm. There is mention of status post repair of aortic root. Remainder of laboratory data:  EKG performed showing sinus tachycardia, heart rate low 100s, and no ST or T wave abnormalities. Sodium is 138, potassium 3.9, chloride 106, bicarbonate 26, BUN 10, creatinine 0.87, glucose 147. LFTs:  Alkaline phosphatase of 157, otherwise within normal limits. Troponin is less than 0.02. WBC is 13.1, hemoglobin 15.8, and platelets 367,000.   ASSESSMENT AND PLAN:  This is a 54 year old Caucasian gentleman with history of coronary artery disease without history of stent placement, renal artery stenosis status post stent placement, and thoracic aortic aneurysm status post repair, presenting with chest pain.  1.  Chest pain, central in location. Admit to telemetry. Trend cardiac enzymes x 3. Initiate aspirin and statin therapy.  2.  Gastroesophageal reflux disease without esophagitis. Proton pump inhibitor therapy. 3.  Hypertension, essential. Lisinopril.  4.  Venous thromboembolism prophylaxis with heparin subcutaneously.  CODE STATUS:  The patient is a full code.  TIME SPENT:  45 minutes.    ____________________________ Cletis Athensavid K. Hower, MD dkh:nb D: 11/02/2014 00:08:13 ET T: 11/02/2014 00:28:50 ET JOB#: [id number]  cc: Cletis Athensavid K. Hower, MD, <Dictator> DAVID Synetta ShadowK HOWER MD ELECTRONICALLY SIGNED 11/02/2014 20:36

## 2016-10-19 IMAGING — CT CT ANGIOGRAPHY HEAD
3 of 8 series · 17 of 47 positions shown · IV contrast (APPLIED)
Comparison: MRI 07/20/2014

CLINICAL DATA: Headache.  Rule out subarachnoid hemorrhage.

EXAM:
CT ANGIOGRAPHY HEAD
TECHNIQUE: Multidetector CT imaging of the head was performed using the
standard protocol during bolus administration of intravenous
contrast. Multiplanar CT image reconstructions and MIPs were
obtained to evaluate the vascular anatomy.
CONTRAST:  100 mL Isovue 370 IV

[Series 5: ax thin · axial · 0.39mm/px · z∈[+12,+142]mm · 11 of 158 slices shown]
[im 14/158  brain]
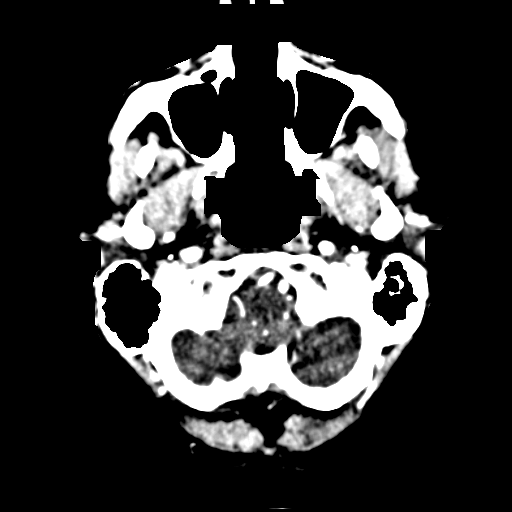
[im 27/158  bone]
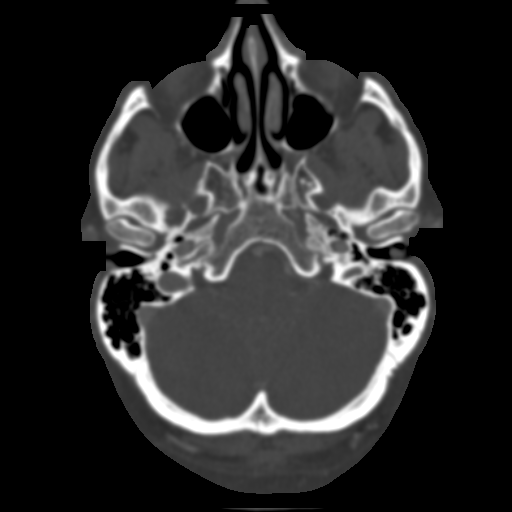
[im 40/158  brain]
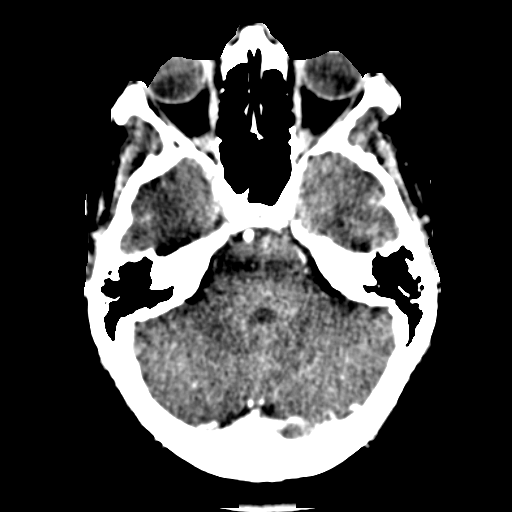
[im 53/158  bone]
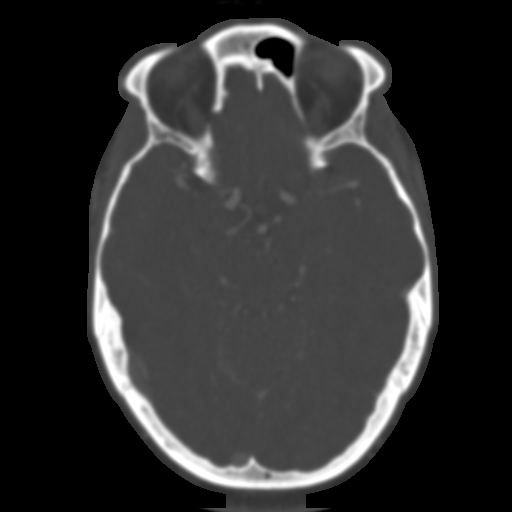
[im 66/158  brain]
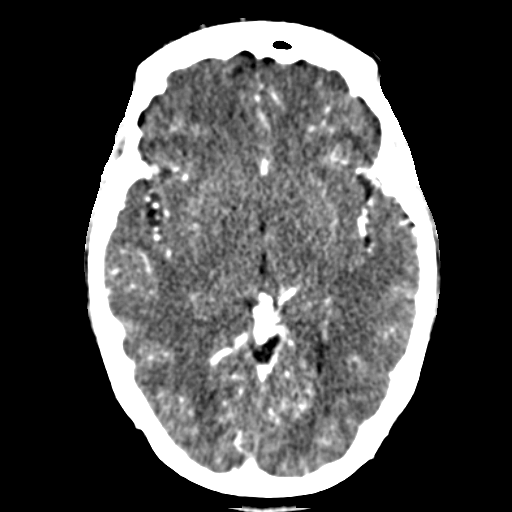
[im 79/158  bone]
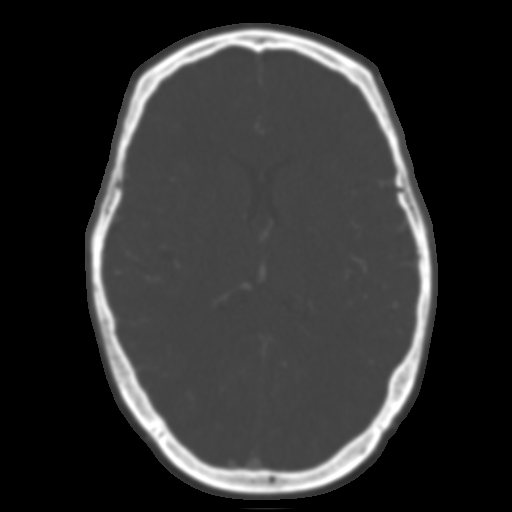
[im 92/158  brain]
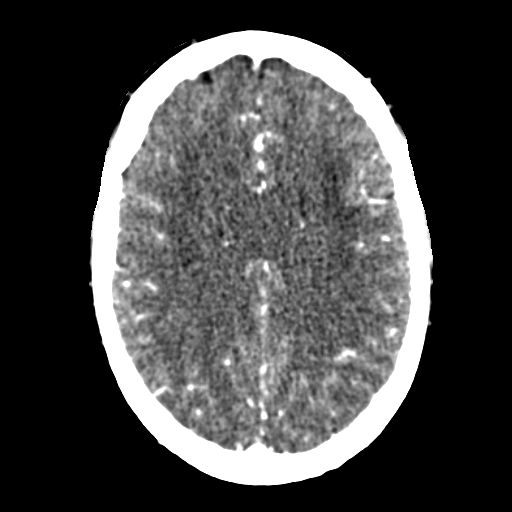
[im 105/158  bone]
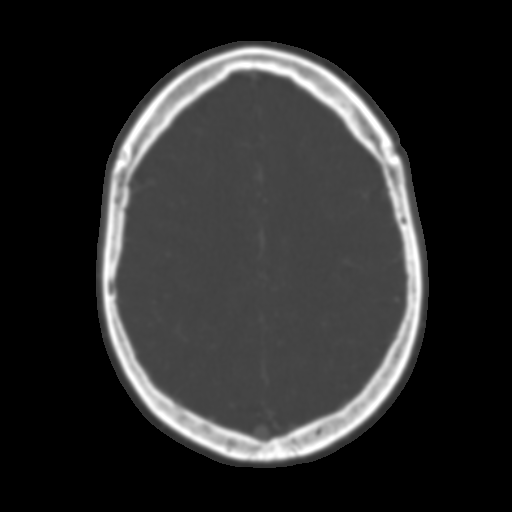
[im 118/158  brain]
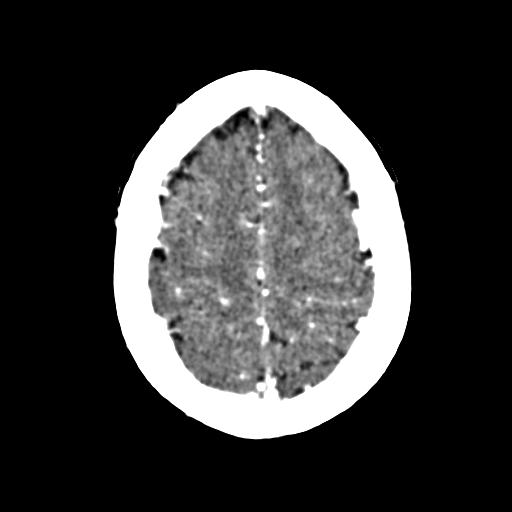
[im 131/158  bone]
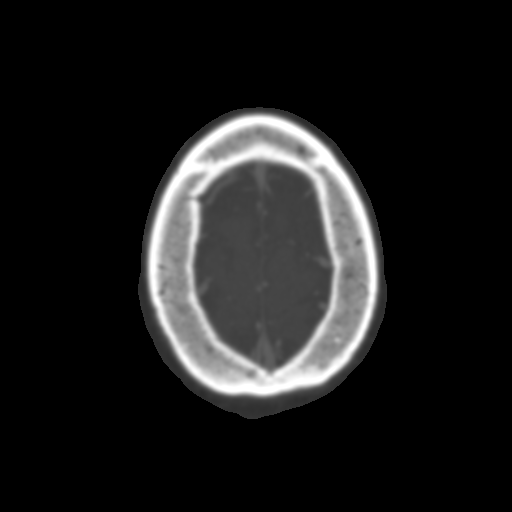
[im 144/158  brain]
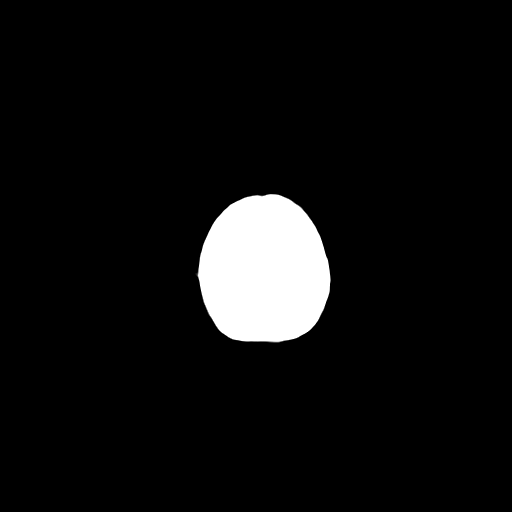

[Series 7: cor thin · coronal · 0.32mm/px · 3 of 189 slices shown]
[im 54/189  brain]
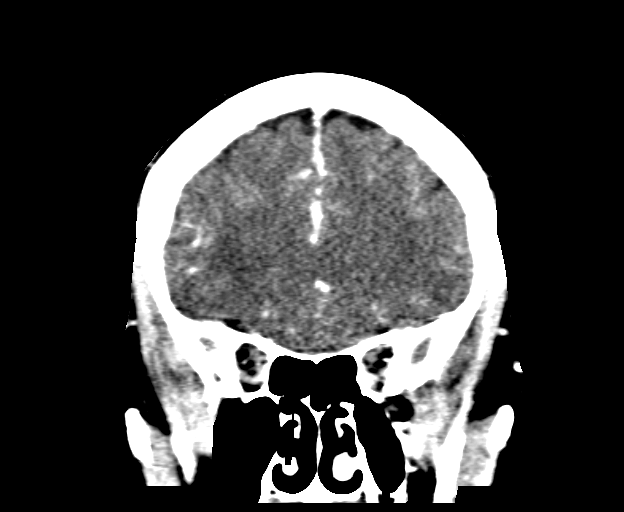
[im 81/189  brain]
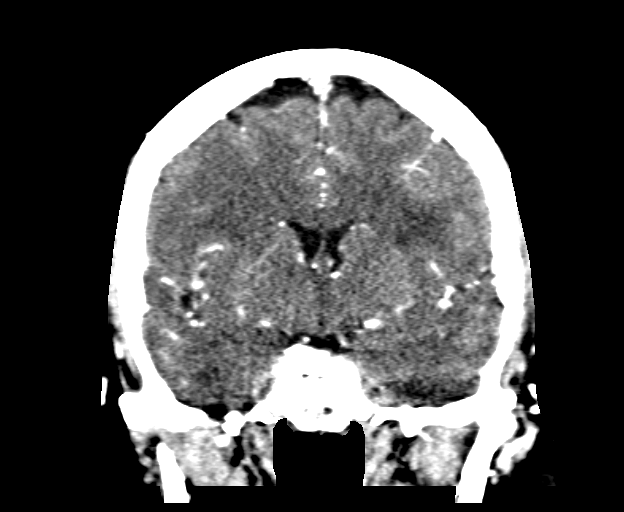
[im 108/189  brain]
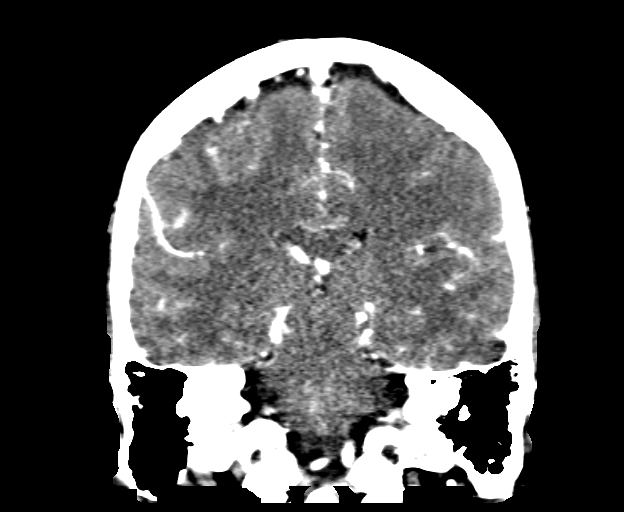

[Series 9: sag thin · sagittal · 0.34mm/px · 3 of 151 slices shown]
[im 31/151  brain]
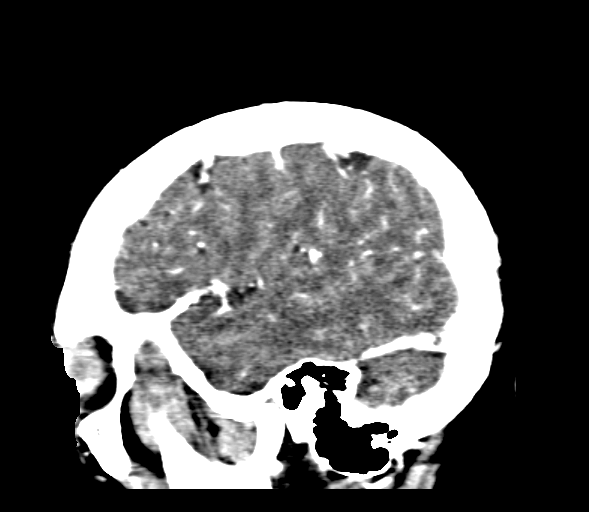
[im 61/151  brain]
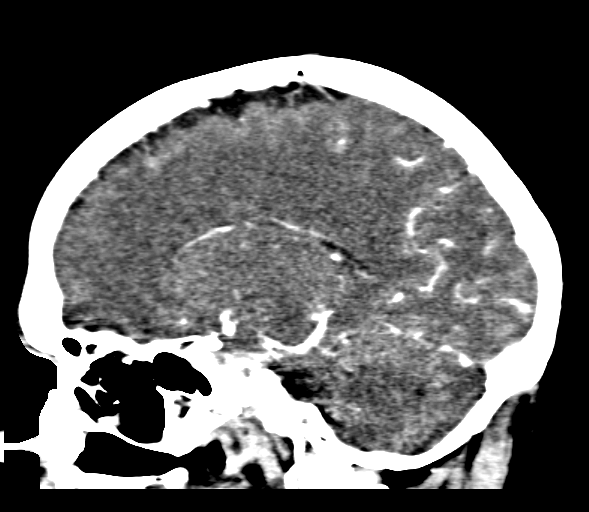
[im 91/151  brain]
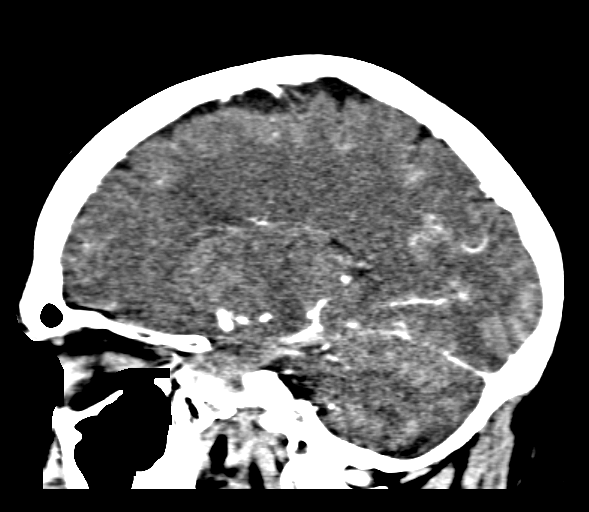

[17 of 47 positions shown; findings below may reference images not displayed]

FINDINGS: Chronic microvascular ischemic change in the white matter as noted
on MRI. No acute infarct. Negative for hemorrhage or mass lesion.

Both vertebral arteries are patent to the basilar without stenosis.
PICA, superior cerebellar, and posterior cerebral arteries are
widely patent bilaterally without stenosis. Posterior communicating
artery is patent on the right.

Cavernous carotid artery widely patent bilaterally without
atherosclerotic disease. Anterior and middle cerebral arteries are
patent bilaterally without stenosis.

Negative for cerebral aneurysm.  Dural sinuses are patent.

Review of the MIP images confirms the above findings.
IMPRESSION: Negative for subarachnoid hemorrhage. Chronic microvascular ischemic
change in the white matter. No acute infarct.

Negative CTA of the head.

## 2021-05-08 DIAGNOSIS — Z01818 Encounter for other preprocedural examination: Secondary | ICD-10-CM

## 2023-08-13 ENCOUNTER — Emergency Department
Admission: EM | Admit: 2023-08-13 | Discharge: 2023-08-13 | Disposition: A | Discharge status: Home / Self Care | Payer: Medicare Other | Attending: Emergency Medicine | Admitting: Emergency Medicine

## 2023-08-13 ENCOUNTER — Other Ambulatory Visit: Payer: Self-pay

## 2023-08-13 ENCOUNTER — Encounter: Payer: Self-pay | Admitting: Emergency Medicine

## 2023-08-13 ENCOUNTER — Emergency Department: Payer: Medicare Other

## 2023-08-13 DIAGNOSIS — Z8673 Personal history of transient ischemic attack (TIA), and cerebral infarction without residual deficits: Secondary | ICD-10-CM | POA: Insufficient documentation

## 2023-08-13 DIAGNOSIS — R079 Chest pain, unspecified: Secondary | ICD-10-CM | POA: Insufficient documentation

## 2023-08-13 DIAGNOSIS — I10 Essential (primary) hypertension: Secondary | ICD-10-CM | POA: Diagnosis not present

## 2023-08-13 DIAGNOSIS — I2510 Atherosclerotic heart disease of native coronary artery without angina pectoris: Secondary | ICD-10-CM | POA: Diagnosis not present

## 2023-08-13 LAB — TROPONIN I (HIGH SENSITIVITY)
Troponin I (High Sensitivity): 6 ng/L (ref <18)
Troponin I (High Sensitivity): 4 ng/L (ref <18)

## 2023-08-13 LAB — BASIC METABOLIC PANEL
Anion gap: 9 (ref 5–15)
BUN: 13 mg/dL (ref 6–20)
CO2: 26 mmol/L (ref 22–32)
Calcium: 8.9 mg/dL (ref 8.9–10.3)
Chloride: 99 mmol/L (ref 98–111)
Creatinine, Ser: 1 mg/dL (ref 0.61–1.24)
GFR, Estimated: >60 mL/min (ref >60)
Glucose, Bld: 126 mg/dL — ABNORMAL HIGH (ref 70–99)
Potassium: 4.4 mmol/L (ref 3.5–5.1)
Sodium: 134 mmol/L — ABNORMAL LOW (ref 135–145)

## 2023-08-13 LAB — PROTIME-INR
INR: 2.4 — ABNORMAL HIGH (ref 0.8–1.2)
Prothrombin Time: 26.7 s — ABNORMAL HIGH (ref 11.4–15.2)

## 2023-08-13 LAB — CBC
HCT: 48 % (ref 39.0–52.0)
Hemoglobin: 15.8 g/dL (ref 13.0–17.0)
MCH: 29.2 pg (ref 26.0–34.0)
MCHC: 32.9 g/dL (ref 30.0–36.0)
MCV: 88.6 fL (ref 80.0–100.0)
Platelets: 343 10*3/uL (ref 150–400)
RBC: 5.42 MIL/uL (ref 4.22–5.81)
RDW: 13.8 % (ref 11.5–15.5)
WBC: 10.2 10*3/uL (ref 4.0–10.5)
nRBC: 0 % (ref 0.0–0.2)

## 2023-08-13 MED ORDER — IOHEXOL 350 MG/ML SOLN
100.0000 mL | Freq: Once | INTRAVENOUS | Status: AC | PRN
  Administered 2023-08-13: 100 mL via INTRAVENOUS

## 2023-08-13 MED ORDER — ONDANSETRON HCL 4 MG/2ML IJ SOLN
4.0000 mg | Freq: Once | INTRAMUSCULAR | Status: AC
  Administered 2023-08-13: 4 mg via INTRAVENOUS
  Filled 2023-08-13: qty 2

## 2023-08-13 NOTE — ED Triage Notes (Signed)
Patient to ED via EMS from home for centralized CP. Started this afternoon. Non-radiating, tight feeling with some SOB. Hx AAA. On blood thinners.

## 2023-08-13 NOTE — ED Provider Notes (Signed)
[facility] Provider Note   Event Date/Time   First MD Initiated Contact with Patient 08/13/23 1948     (approximate) History  Chest Pain  HPI Bradley Prince is a 62 y.o. male with a past medical history of CAD, peripheral artery disease, TIA, hyperlipidemia, and hypertension who presents complaining of centralized chest pain that began this afternoon and has subsided since onset.  Patient states that he has never had pain similar to this in the past and originally thought that it was reflux.  Patient states that his history he wanted to come to the emergency department to be safe. ROS: Patient currently denies any vision changes, tinnitus, difficulty speaking, facial droop, sore throat, chest pain, shortness of breath, abdominal pain, nausea/vomiting/diarrhea, dysuria, or weakness/numbness/paresthesias in any extremity   Physical Exam  Triage Vital Signs: ED Triage Vitals  Encounter Vitals Group     BP 08/13/23 1738 109/86     Systolic BP Percentile --      Diastolic BP Percentile --      Pulse Rate 08/13/23 1738 87     Resp 08/13/23 1738 18     Temp 08/13/23 1738 98.6 F (37 C)     Temp Source 08/13/23 1738 Oral     SpO2 08/13/23 1738 98 %     Weight 08/13/23 1735 230 lb (104.3 kg)     Height 08/13/23 1735 5\' 11"  (1.803 m)     Head Circumference --      Peak Flow --      Pain Score 08/13/23 1735 6     Pain Loc --      Pain Education --      Exclude from Growth Chart --    Most recent vital signs: Vitals:   08/13/23 1738 08/13/23 2042  BP: 109/86 (!) 159/96  Pulse: 87 78  Resp: 18 18  Temp: 98.6 F (37 C)   SpO2: 98% 97%   General: Awake, oriented x4. CV:  Good peripheral perfusion.  Resp:  Normal effort.  Abd:  No distention.  Other:  Middle-aged obese Caucasian male resting comfortably in no acute distress ED Results / Procedures / Treatments  Labs (all labs ordered are listed, but only abnormal results are displayed) Labs Reviewed   BASIC METABOLIC PANEL - Abnormal; Notable for the following components:      Result Value   Sodium 134 (*)    Glucose, Bld 126 (*)    All other components within normal limits  PROTIME-INR - Abnormal; Notable for the following components:   Prothrombin Time 26.7 (*)    INR 2.4 (*)    All other components within normal limits  CBC  TROPONIN I (HIGH SENSITIVITY)  TROPONIN I (HIGH SENSITIVITY)   EKG ED ECG REPORT I, Merwyn Katos, the attending physician, personally viewed and interpreted this ECG. Date: 08/13/2023 EKG Time: 1735 Rate: 90 Rhythm: normal sinus rhythm QRS Axis: normal Intervals: normal ST/T Wave abnormalities: normal Narrative Interpretation: no evidence of acute ischemia RADIOLOGY ED MD interpretation: 2 view chest x-ray interpreted by me shows no evidence of acute abnormalities including no pneumonia, pneumothorax, or widened mediastinum -Agree with radiology assessment Official radiology report(s): CT Angio Chest PE W/Cm &/Or Wo Cm  Result Date: 08/13/2023 CLINICAL DATA:  Pulmonary embolism (PE) suspected, high prob, chest pain, dyspnea. History of aortic root repair EXAM: CT ANGIOGRAPHY CHEST WITH CONTRAST TECHNIQUE: Multidetector CT imaging of the chest was performed using the standard protocol during bolus administration of intravenous  contrast. Multiplanar CT image reconstructions and MIPs were obtained to evaluate the vascular anatomy. RADIATION DOSE REDUCTION: This exam was performed according to the departmental dose-optimization program which includes automated exposure control, adjustment of the mA and/or kV according to patient size and/or use of iterative reconstruction technique. CONTRAST:  OMNIPAQUE IOHEXOL 350 MG/ML SOLN COMPARISON:  None Available. FINDINGS: Cardiovascular: There is adequate opacification of the pulmonary arterial tree. No intraluminal filling defect identified to suggest acute pulmonary embolism. The central pulmonary arteries  are enlarged in keeping with changes of pulmonary arterial hypertension. Mild coronary artery calcification. Global cardiac size within normal limits. No pericardial effusion. Status post tube graft repair of the ascending aorta. Descending thoracic aorta is of normal caliber. Mediastinum/Nodes: No enlarged mediastinal, hilar, or axillary lymph nodes. Thyroid gland, trachea, and esophagus demonstrate no significant findings. Lungs/Pleura: Lungs are clear. No pleural effusion or pneumothorax. Upper Abdomen: 3.1 cm simple cyst is seen within the left hepatic lobe. Status post cholecystectomy and right renal artery stenting. No acute abnormality. 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 No acute bone abnormality. No lytic or blastic bone lesion. Review of the MIP images confirms the above findings. IMPRESSION: 1. No pulmonary embolism. No acute intrathoracic pathology identified. 2. Mild coronary artery calcification. 3. Status post tube graft repair of the ascending aorta. 4. Stable enlargement of the central pulmonary arteries in keeping with changes of pulmonary arterial hypertension. Electronically Signed   By: Helyn Numbers M.D.   On: 08/13/2023 22:13   DG Chest 2 View  Result Date: 08/13/2023 CLINICAL DATA:  Chest pain. EXAM: CHEST - 2 VIEW COMPARISON:  05/08/2021. FINDINGS: Bilateral lung fields are clear. Bilateral costophrenic angles are clear. Stable cardio-mediastinal silhouette. Median sternotomy wires noted. No acute osseous abnormalities. The soft tissues are within normal limits. IMPRESSION: No active cardiopulmonary disease. Electronically Signed   By: Jules Schick M.D.   On: 08/13/2023 18:51   PROCEDURES: Critical Care performed: No .1-3 Lead EKG Interpretation  Performed by: Merwyn Katos, MD Authorized by: Merwyn Katos, MD     Interpretation: normal     ECG rate:  71   ECG rate assessment: normal     Rhythm: sinus rhythm     Ectopy: none      Conduction: normal    MEDICATIONS ORDERED IN ED: Medications  iohexol (OMNIPAQUE) 350 MG/ML injection 100 mL (100 mLs Intravenous Contrast Given 08/13/23 2058)  ondansetron (ZOFRAN) injection 4 mg (4 mg Intravenous Given 08/13/23 2110)   IMPRESSION / MDM / ASSESSMENT AND PLAN / ED COURSE  I reviewed the triage vital signs and the nursing notes.                             The patient is on the cardiac monitor to evaluate for evidence of arrhythmia and/or significant heart rate changes. Patient's presentation is most consistent with acute presentation with potential threat to life or bodily function. Workup: ECG, CXR, CBC, BMP, Troponin Findings: ECG: No overt evidence of STEMI. No evidence of Brugada's sign, delta wave, epsilon wave, significantly prolonged QTc, or malignant arrhythmia HS Troponin: Negative x1 Other Labs unremarkable for emergent problems. CXR: Without PTX, PNA, or widened mediastinum Last Stress Test: 2017 Last Heart Catheterization:  1996 HEART Score: 4  Given History, Exam, and Workup I have low suspicion for ACS, Pneumothorax, Pneumonia, Pulmonary Embolus, Tamponade, Aortic Dissection or other emergent problem as a cause for this presentation.  Reassesment: Prior to discharge patient's pain was controlled and they were well appearing.  Disposition:  Discharge. Strict return precautions discussed with patient with full understanding. Advised patient to follow up promptly with primary care provider    FINAL CLINICAL IMPRESSION(S) / ED DIAGNOSES   Final diagnoses:  Chest pain, unspecified type  Hypertension, unspecified type   Rx / DC Orders   ED Discharge Orders     None      Note:  This document was prepared using Dragon voice recognition software and may include unintentional dictation errors.   Merwyn Katos, MD 08/13/23 2229
